# Patient Record
Sex: Female | Born: 1945 | Race: White | Hispanic: No | Marital: Married | Smoking: Never smoker
Health system: Southern US, Community
[De-identification: ages and names within clinical notes are randomized; demographics above are authoritative.]

## PROBLEM LIST (undated history)

## (undated) DIAGNOSIS — E785 Hyperlipidemia, unspecified: Secondary | ICD-10-CM

## (undated) DIAGNOSIS — K250 Acute gastric ulcer with hemorrhage: Secondary | ICD-10-CM

## (undated) DIAGNOSIS — M810 Age-related osteoporosis without current pathological fracture: Secondary | ICD-10-CM

## (undated) DIAGNOSIS — M1990 Unspecified osteoarthritis, unspecified site: Secondary | ICD-10-CM

## (undated) DIAGNOSIS — K5790 Diverticulosis of intestine, part unspecified, without perforation or abscess without bleeding: Secondary | ICD-10-CM

## (undated) DIAGNOSIS — H269 Unspecified cataract: Secondary | ICD-10-CM

## (undated) DIAGNOSIS — Z5189 Encounter for other specified aftercare: Secondary | ICD-10-CM

## (undated) DIAGNOSIS — K219 Gastro-esophageal reflux disease without esophagitis: Secondary | ICD-10-CM

## (undated) HISTORY — DX: Hyperlipidemia, unspecified: E78.5

## (undated) HISTORY — DX: Unspecified cataract: H26.9

## (undated) HISTORY — DX: Unspecified osteoarthritis, unspecified site: M19.90

## (undated) HISTORY — PX: ABDOMINAL HYSTERECTOMY: SHX81

## (undated) HISTORY — DX: Acute gastric ulcer with hemorrhage: K25.0

## (undated) HISTORY — PX: TONSILLECTOMY: SHX5217

## (undated) HISTORY — DX: Gastro-esophageal reflux disease without esophagitis: K21.9

## (undated) HISTORY — DX: Age-related osteoporosis without current pathological fracture: M81.0

## (undated) HISTORY — DX: Encounter for other specified aftercare: Z51.89

## (undated) HISTORY — PX: EYE SURGERY: SHX253

## (undated) HISTORY — DX: Diverticulosis of intestine, part unspecified, without perforation or abscess without bleeding: K57.90

---

## 1998-11-03 ENCOUNTER — Other Ambulatory Visit: Admission: RE | Admit: 1998-11-03 | Discharge: 1998-11-03 | Payer: Self-pay | Admitting: Obstetrics and Gynecology

## 2000-03-08 ENCOUNTER — Other Ambulatory Visit: Admission: RE | Admit: 2000-03-08 | Discharge: 2000-03-08 | Payer: Self-pay | Admitting: Obstetrics and Gynecology

## 2001-12-25 ENCOUNTER — Other Ambulatory Visit: Admission: RE | Admit: 2001-12-25 | Discharge: 2001-12-25 | Payer: Self-pay | Admitting: Obstetrics and Gynecology

## 2001-12-31 ENCOUNTER — Encounter: Payer: Self-pay | Admitting: Obstetrics and Gynecology

## 2001-12-31 ENCOUNTER — Encounter: Admission: RE | Admit: 2001-12-31 | Discharge: 2001-12-31 | Payer: Self-pay | Admitting: Obstetrics and Gynecology

## 2003-07-10 ENCOUNTER — Encounter: Admission: RE | Admit: 2003-07-10 | Discharge: 2003-07-10 | Payer: Self-pay | Admitting: Obstetrics and Gynecology

## 2003-07-10 ENCOUNTER — Encounter: Payer: Self-pay | Admitting: Obstetrics and Gynecology

## 2005-11-18 ENCOUNTER — Encounter: Admission: RE | Admit: 2005-11-18 | Discharge: 2005-11-18 | Payer: Self-pay | Admitting: Obstetrics and Gynecology

## 2006-03-03 ENCOUNTER — Emergency Department (HOSPITAL_COMMUNITY): Admission: EM | Admit: 2006-03-03 | Discharge: 2006-03-04 | Payer: Self-pay | Admitting: Emergency Medicine

## 2008-07-31 ENCOUNTER — Encounter: Admission: RE | Admit: 2008-07-31 | Discharge: 2008-07-31 | Payer: Self-pay | Admitting: Obstetrics and Gynecology

## 2008-08-06 ENCOUNTER — Encounter: Admission: RE | Admit: 2008-08-06 | Discharge: 2008-08-06 | Payer: Self-pay | Admitting: Obstetrics and Gynecology

## 2010-08-12 ENCOUNTER — Encounter: Admission: RE | Admit: 2010-08-12 | Discharge: 2010-08-12 | Payer: Self-pay | Admitting: Obstetrics and Gynecology

## 2010-12-12 DIAGNOSIS — Z860101 Personal history of adenomatous and serrated colon polyps: Secondary | ICD-10-CM

## 2010-12-12 HISTORY — DX: Personal history of adenomatous and serrated colon polyps: Z86.0101

## 2011-03-31 ENCOUNTER — Encounter: Payer: Self-pay | Admitting: Internal Medicine

## 2011-03-31 ENCOUNTER — Ambulatory Visit (AMBULATORY_SURGERY_CENTER): Payer: 59 | Admitting: *Deleted

## 2011-03-31 VITALS — Ht 62.0 in | Wt 115.0 lb

## 2011-03-31 DIAGNOSIS — Z1211 Encounter for screening for malignant neoplasm of colon: Secondary | ICD-10-CM

## 2011-03-31 MED ORDER — PEG-KCL-NACL-NASULF-NA ASC-C 100 G PO SOLR: 1.0000 | Freq: Once | ORAL | Status: AC

## 2011-04-22 ENCOUNTER — Ambulatory Visit (AMBULATORY_SURGERY_CENTER): Payer: 59 | Admitting: Internal Medicine

## 2011-04-22 ENCOUNTER — Encounter: Payer: Self-pay | Admitting: Internal Medicine

## 2011-04-22 VITALS — BP 146/73 | HR 120 | Temp 96.8°F | Resp 21 | Ht 62.0 in | Wt 112.0 lb

## 2011-04-22 DIAGNOSIS — D126 Benign neoplasm of colon, unspecified: Secondary | ICD-10-CM

## 2011-04-22 DIAGNOSIS — Z1211 Encounter for screening for malignant neoplasm of colon: Secondary | ICD-10-CM

## 2011-04-22 DIAGNOSIS — K5730 Diverticulosis of large intestine without perforation or abscess without bleeding: Secondary | ICD-10-CM

## 2011-04-22 NOTE — Patient Instructions (Signed)
Please follow all discharge instructions given to you today. One colon polyps removed and diverticulosis seen. Read over handouts given on polyps,diverticulosis and high fiber diet. Please repeat colonoscopy in 5 or 10 years, you will receive result letter in your mail in about 2 weeks. Resume current medications. Any questions or concerns give [place] a call.

## 2011-04-25 ENCOUNTER — Telehealth: Payer: Self-pay | Admitting: *Deleted

## 2011-04-25 NOTE — Telephone Encounter (Signed)

## 2011-09-07 NOTE — Progress Notes (Signed)
Addended by: Maple Hudson on: 09/07/2011 04:39 PM   Modules accepted: Level of Service

## 2012-02-14 ENCOUNTER — Other Ambulatory Visit: Payer: Self-pay | Admitting: Obstetrics and Gynecology

## 2012-02-14 DIAGNOSIS — Z1231 Encounter for screening mammogram for malignant neoplasm of breast: Secondary | ICD-10-CM

## 2012-03-07 ENCOUNTER — Ambulatory Visit: Payer: 59

## 2014-04-01 ENCOUNTER — Ambulatory Visit: Payer: No Typology Code available for payment source

## 2014-04-01 ENCOUNTER — Ambulatory Visit (INDEPENDENT_AMBULATORY_CARE_PROVIDER_SITE_OTHER): Payer: No Typology Code available for payment source | Admitting: Family Medicine

## 2014-04-01 VITALS — BP 126/74 | HR 72 | Temp 98.2°F | Resp 18 | Ht 62.0 in | Wt 121.0 lb

## 2014-04-01 DIAGNOSIS — S92309A Fracture of unspecified metatarsal bone(s), unspecified foot, initial encounter for closed fracture: Secondary | ICD-10-CM

## 2014-04-01 DIAGNOSIS — M25579 Pain in unspecified ankle and joints of unspecified foot: Secondary | ICD-10-CM

## 2014-04-01 DIAGNOSIS — S92302A Fracture of unspecified metatarsal bone(s), left foot, initial encounter for closed fracture: Secondary | ICD-10-CM

## 2014-04-01 NOTE — Progress Notes (Signed)
   Subjective:    Patient ID: Dana Waters, female    DOB: January 01, 1946, 68 y.o.   MRN: [id number]  HPI 68 year old female presents for evaluation of left foot pain x 2 weeks. Symptoms started after an injury that occurred Easter weekend while she was [place] the beach. She is not sure exactly what happened but tripped over her foot and may have struck it against a door jam.  Did have pain initially but did not think it was "that bad."  Has been able to bear weight but has pain while doing so. Also admits to pain at rest.  She has some swelling in her foot that is worse at the end of the day.  Has taken ibuprofen prn which does seem to help.  Denies paresthesias or weakness.  Patient is otherwise doing well with no other concerns today.      Review of Systems  Musculoskeletal: Positive for joint swelling. Negative for gait problem.  Skin: Positive for color change. Negative for wound.  Neurological: Negative for weakness and numbness.       Objective:   Physical Exam  Constitutional: She is oriented to person, place, and time. She appears well-developed and well-nourished.  HENT:  Head: Normocephalic and atraumatic.  Right Ear: External ear normal.  Left Ear: External ear normal.  Eyes: Conjunctivae are normal.  Neck: Normal range of motion.  Cardiovascular: Normal rate.   Pulmonary/Chest: Effort normal.  Musculoskeletal:       Left ankle: Normal.       Feet:  Noted area has ecchymosis and STS.  +TTP at 4th MTP and along 4th metatarsal. Capillary refill normal <2 seconds. Sensation intact. Strength and ROM normal.   Neurological: She is alert and oriented to person, place, and time.  Psychiatric: She has a normal mood and affect. Her behavior is normal. Judgment and thought content normal.     [facility] reading (PRIMARY) by  Dr. Lorelei Pont as transverse fracture of 4th and 5th metatarsal with minimal displacement.       Assessment & Plan:  Pain in joint, ankle and foot - Plan: DG Foot  Complete Left  Fracture of metatarsal bone of left foot  Placed in short cam walker for comfort.  Referral to Orthopedics for evaluation and management - unstable fracture of 2 metatarsals.  Patient declined crutches at this time - will await instruction from Ortho.

## 2014-05-08 ENCOUNTER — Other Ambulatory Visit: Payer: Self-pay | Admitting: Obstetrics and Gynecology

## 2014-06-10 DIAGNOSIS — G4700 Insomnia, unspecified: Secondary | ICD-10-CM | POA: Insufficient documentation

## 2014-06-10 DIAGNOSIS — H11009 Unspecified pterygium of unspecified eye: Secondary | ICD-10-CM | POA: Insufficient documentation

## 2014-06-10 DIAGNOSIS — F4329 Adjustment disorder with other symptoms: Secondary | ICD-10-CM | POA: Insufficient documentation

## 2015-09-20 ENCOUNTER — Ambulatory Visit (INDEPENDENT_AMBULATORY_CARE_PROVIDER_SITE_OTHER): Payer: Medicare Other

## 2015-09-20 ENCOUNTER — Ambulatory Visit (INDEPENDENT_AMBULATORY_CARE_PROVIDER_SITE_OTHER): Payer: Medicare Other | Admitting: Internal Medicine

## 2015-09-20 ENCOUNTER — Encounter (HOSPITAL_COMMUNITY): Payer: Self-pay | Admitting: *Deleted

## 2015-09-20 ENCOUNTER — Emergency Department (HOSPITAL_COMMUNITY)
Admission: EM | Admit: 2015-09-20 | Discharge: 2015-09-20 | Disposition: A | Discharge status: Home / Self Care | Payer: Medicare Other | Attending: Emergency Medicine | Admitting: Emergency Medicine

## 2015-09-20 ENCOUNTER — Emergency Department (HOSPITAL_COMMUNITY): Payer: Medicare Other

## 2015-09-20 VITALS — BP 134/80 | HR 64 | Temp 98.0°F | Resp 12 | Ht 62.0 in | Wt 121.0 lb

## 2015-09-20 DIAGNOSIS — X58XXXA Exposure to other specified factors, initial encounter: Secondary | ICD-10-CM | POA: Insufficient documentation

## 2015-09-20 DIAGNOSIS — R519 Headache, unspecified: Secondary | ICD-10-CM

## 2015-09-20 DIAGNOSIS — S0033XA Contusion of nose, initial encounter: Secondary | ICD-10-CM | POA: Diagnosis not present

## 2015-09-20 DIAGNOSIS — R51 Headache: Secondary | ICD-10-CM | POA: Diagnosis not present

## 2015-09-20 DIAGNOSIS — Y9289 Other specified places as the place of occurrence of the external cause: Secondary | ICD-10-CM | POA: Diagnosis not present

## 2015-09-20 DIAGNOSIS — Z79899 Other long term (current) drug therapy: Secondary | ICD-10-CM | POA: Diagnosis not present

## 2015-09-20 DIAGNOSIS — S0031XA Abrasion of nose, initial encounter: Secondary | ICD-10-CM | POA: Diagnosis not present

## 2015-09-20 DIAGNOSIS — S0121XA Laceration without foreign body of nose, initial encounter: Secondary | ICD-10-CM

## 2015-09-20 DIAGNOSIS — S0120XA Unspecified open wound of nose, initial encounter: Secondary | ICD-10-CM

## 2015-09-20 DIAGNOSIS — S022XXB Fracture of nasal bones, initial encounter for open fracture: Secondary | ICD-10-CM | POA: Insufficient documentation

## 2015-09-20 DIAGNOSIS — Z23 Encounter for immunization: Secondary | ICD-10-CM

## 2015-09-20 DIAGNOSIS — Y998 Other external cause status: Secondary | ICD-10-CM | POA: Diagnosis not present

## 2015-09-20 DIAGNOSIS — Y9389 Activity, other specified: Secondary | ICD-10-CM | POA: Diagnosis not present

## 2015-09-20 MED ORDER — OXYCODONE-ACETAMINOPHEN 5-325 MG PO TABS
1.0000 | ORAL_TABLET | Freq: Once | ORAL | Status: AC
  Administered 2015-09-20: 1 via ORAL
  Filled 2015-09-20: qty 1

## 2015-09-20 MED ORDER — OXYCODONE-ACETAMINOPHEN 5-325 MG PO TABS: 1.0000 | ORAL_TABLET | Freq: Four times a day (QID) | ORAL | Status: DC | PRN

## 2015-09-20 MED ORDER — HYDROCODONE-ACETAMINOPHEN 5-325 MG PO TABS: 1.0000 | ORAL_TABLET | Freq: Four times a day (QID) | ORAL | Status: DC | PRN

## 2015-09-20 MED ORDER — AMOXICILLIN-POT CLAVULANATE 875-125 MG PO TABS: 1.0000 | ORAL_TABLET | Freq: Two times a day (BID) | ORAL | Status: DC

## 2015-09-20 NOTE — ED Notes (Signed)
Patient and family voiced concerns about not seeing plastic surgery as soon as they got here. Advised the ER MD has to examine then consult which they just put in. Family asked how long would it be advised could not give a timeline.

## 2015-09-20 NOTE — ED Provider Notes (Signed)
CSN: [id number]     Arrival date & time 09/20/15  1641 History   First MD Initiated Contact with Patient 09/20/15 1803     Chief Complaint  Patient presents with  . Laceration     (Consider location/radiation/quality/duration/timing/severity/associated sxs/prior Treatment) HPI Comments: 69yo  F who p/w nose laceration. Earlier today, the patient tripped and fell over a curb and landed face first on the pavement. She did not lose consciousness. She sustained a laceration to her nose and had bleeding from laceration site as well as bilateral nares. She went to an urgent care, where plain films showed a nasal bone fracture and she was sent here for plastic surgery evaluation. She received tetanus prior to transport. Patient scraped her left knee but she otherwise did not sustain any other injuries. She denies any visual changes, headache, neck pain, or extremity pain.  Patient is a 69 y.o. female presenting with skin laceration. The history is provided by the patient.  Laceration   History reviewed. No pertinent past medical history. Past Surgical History  Procedure Laterality Date  . Abdominal hysterectomy    . Tonsillectomy     Family History  Problem Relation Age of Onset  . Colon cancer Paternal Aunt   . Hypertension Mother   . Stroke Mother   . Hypertension Father   . Hypertension Sister    Social History  Substance Use Topics  . Smoking status: Never Smoker   . Smokeless tobacco: Never Used  . Alcohol Use: 1.5 oz/week    3 drink(s) per week   OB History    No data available     Review of Systems  10 Systems reviewed and are negative for acute change except as noted in the HPI.   Allergies  Review of patient's allergies indicates no known allergies.  Home Medications   Prior to Admission medications   Medication Sig Start Date End Date Taking? Authorizing Provider  CALCIUM-VITAMIN D PO Take 1 tablet by mouth daily.   Yes Historical Provider, MD  citalopram  (CELEXA) 20 MG tablet Take 20 mg by mouth at bedtime.    Yes Historical Provider, MD  estradiol (ESTRACE) 2 MG tablet Take 2 mg by mouth daily.   Yes Historical Provider, MD  ibuprofen (ADVIL,MOTRIN) 200 MG tablet Take 200 mg by mouth every 8 (eight) hours as needed for moderate pain.    Yes Historical Provider, MD  Multiple Vitamins-Minerals (ALIVE WOMENS 50+) TABS Take 1 tablet by mouth daily as needed (when remembers).   Yes Historical Provider, MD  amoxicillin-clavulanate (AUGMENTIN) 875-125 MG tablet Take 1 tablet by mouth every 12 (twelve) hours. 09/20/15   Sharlett Iles, MD  HYDROcodone-acetaminophen (NORCO/VICODIN) 5-325 MG tablet Take 1 tablet by mouth every 6 (six) hours as needed. 09/20/15   Orma Flaming, MD  oxyCODONE-acetaminophen (PERCOCET) 5-325 MG tablet Take 1 tablet by mouth every 6 (six) hours as needed for severe pain. 09/20/15   Wenda Overland Little, MD   BP 106/83 mmHg  Pulse 66  Temp(Src) 98 F (36.7 C) (Oral)  Resp 18  Ht 5\' 2"  (1.575 m)  Wt 123 lb (55.792 kg)  BMI 22.49 kg/m2  SpO2 99% Physical Exam  Constitutional: She is oriented to person, place, and time. She appears well-developed and well-nourished. No distress.  HENT:  Head: Normocephalic.  Moist mucous membranes, posterior oropharynx clear, 1 cm laceration over top of nasal bridge with some nasal bone exposure; edema and developing ecchymoses on b/l sides of nose  Eyes: Conjunctivae and EOM are normal. Pupils are equal, round, and reactive to light.  Neck: Neck supple.  Cardiovascular: Normal rate, regular rhythm and normal heart sounds.   No murmur heard. Pulmonary/Chest: Effort normal and breath sounds normal.  Abdominal: Soft. Bowel sounds are normal. She exhibits no distension. There is no tenderness.  Musculoskeletal: She exhibits no edema.  Neurological: She is alert and oriented to person, place, and time.  Fluent speech  Skin: Skin is warm and dry.  Psychiatric: She has a normal mood and  affect. Judgment normal.  Nursing note and vitals reviewed.   ED Course  Procedures (including critical care time) Labs Review Labs Reviewed - No data to display  Imaging Review Dg Nasal Bones  09/20/2015   CLINICAL DATA:  Status post fall and trauma to the nose.  EXAM: NASAL BONES - 3+ VIEW  COMPARISON:  None.  FINDINGS: There is a comminuted fracture of the tip of the nasal bones bilaterally, with minimal overlying soft tissue irregularity. The acuity of this finding is unknown. Linear lucencies through the bilateral orbits likely represent closed eye lids.  Dental hardware is noted. Visualized paranasal sinuses are normally aerated.  IMPRESSION: Comminuted bilateral nasal fracture with unknown acuity.   Electronically Signed   By: Fidela Salisbury M.D.   On: 09/20/2015 15:37   Ct Maxillofacial Wo Cm  09/20/2015   CLINICAL DATA:  Fall on face. Laceration to bridge of nose from glasses.  EXAM: CT MAXILLOFACIAL WITHOUT CONTRAST  TECHNIQUE: Multidetector CT imaging of the maxillofacial structures was performed. Multiplanar CT image reconstructions were also generated. A small metallic BB was placed on the right temple in order to reliably differentiate right from left.  COMPARISON:  Nasal bone radiographs from the same day.  FINDINGS: Bilateral nasal bone fractures are confirmed. These appear acute. There is significant laceration over the nasal bones. No radiopaque foreign body is evident.  There is no acute fracture in the nasal septum. Prominent leftward spurring extends 9.5 mm left of midline and contacts the left middle turbinate. This is not obstructive. The ostiomeatal complex is patent bilaterally. The paranasal sinuses and mastoid air cells are otherwise clear.  Limited imaging the brain demonstrates mild Presas matter disease, within normal limits for age. The globes and orbits are intact.  IMPRESSION: 1. Bilateral nasal bone fractures with mild displacement. 2. Prominent laceration over the  nasal bones without radiopaque foreign body. 3. Prominent leftward nasal septal spurring without acute fracture in the nasal septum.   Electronically Signed   By: San Morelle M.D.   On: 09/20/2015 19:25    Medications  oxyCODONE-acetaminophen (PERCOCET/ROXICET) 5-325 MG per tablet 1 tablet (not administered)    MDM   Final diagnoses:  Laceration of nose, initial encounter  Nasal bone fractures, open, initial encounter   69 year old female who presents with a nasal laceration and underlying nasal bone fracture diagnosed at urgent care after patient fell face first onto pavement. Tetanus vaccination given at [facility]. Pt uncomfortable but NAD at presentation. I was able to see a small amount of bone exposure through laceration. Obtained a maxillofacial CT for better characterization of fractures. At patient request, consulted ENT surgery for evaluation. I appreciate Dr.Teoh's assistance with patient's care. He repaired the laceration at bedside and has instructed patient on wound care and follow-up in one week for suture removal. Per his request, I provided patient with pain medication as well as Augmentin. I reviewed signs of infection as well as nose blowing precautions. Patient  voiced understanding and was discharged in satisfactory condition.   Sharlett Iles, MD 09/20/15 [phone]

## 2015-09-20 NOTE — Discharge Instructions (Signed)
Facial Laceration ° A facial laceration is a cut on the face. These injuries can be painful and cause bleeding. Lacerations usually heal quickly, but they need special care to reduce scarring. °DIAGNOSIS  °Your health care provider will take a medical history, ask for details about how the injury occurred, and examine the wound to determine how deep the cut is. °TREATMENT  °Some facial lacerations may not require closure. Others may not be able to be closed because of an increased risk of infection. The risk of infection and the chance for successful closure will depend on various factors, including the amount of time since the injury occurred. °The wound may be cleaned to help prevent infection. If closure is appropriate, pain medicines may be given if needed. Your health care provider will use stitches (sutures), wound glue (adhesive), or skin adhesive strips to repair the laceration. These tools bring the skin edges together to allow for faster healing and a better cosmetic outcome. If needed, you may also be given a tetanus shot. °HOME CARE INSTRUCTIONS °· Only take over-the-counter or prescription medicines as directed by your health care provider. °· Follow your health care provider's instructions for wound care. These instructions will vary depending on the technique used for closing the wound. °For Sutures: °· Keep the wound clean and dry.   °· If you were given a bandage (dressing), you should change it at least once a day. Also change the dressing if it becomes wet or dirty, or as directed by your health care provider.   °· Wash the wound with soap and water 2 times a day. Rinse the wound off with water to remove all soap. Pat the wound dry with a clean towel.   °· After cleaning, apply a thin layer of the antibiotic ointment recommended by your health care provider. This will help prevent infection and keep the dressing from sticking.   °· You may shower as usual after the first 24 hours. Do not soak the  wound in water until the sutures are removed.   °· Get your sutures removed as directed by your health care provider. With facial lacerations, sutures should usually be taken out after 4-5 days to avoid stitch marks.   °· Wait a few days after your sutures are removed before applying any makeup. °For Skin Adhesive Strips: °· Keep the wound clean and dry.   °· Do not get the skin adhesive strips wet. You may bathe carefully, using caution to keep the wound dry.   °· If the wound gets wet, pat it dry with a clean towel.   °· Skin adhesive strips will fall off on their own. You may trim the strips as the wound heals. Do not remove skin adhesive strips that are still stuck to the wound. They will fall off in time.   °For Wound Adhesive: °· You may briefly wet your wound in the shower or bath. Do not soak or scrub the wound. Do not swim. Avoid periods of heavy sweating until the skin adhesive has fallen off on its own. After showering or bathing, gently pat the wound dry with a clean towel.   °· Do not apply liquid medicine, cream medicine, ointment medicine, or makeup to your wound while the skin adhesive is in place. This may loosen the film before your wound is healed.   °· If a dressing is placed over the wound, be careful not to apply tape directly over the skin adhesive. This may cause the adhesive to be pulled off before the wound is healed.   °· Avoid   prolonged exposure to sunlight or tanning lamps while the skin adhesive is in place. °· The skin adhesive will usually remain in place for 5-10 days, then naturally fall off the skin. Do not pick at the adhesive film.   °After Healing: °Once the wound has healed, cover the wound with sunscreen during the day for 1 full year. This can help minimize scarring. Exposure to ultraviolet light in the first year will darken the scar. It can take 1-2 years for the scar to lose its redness and to heal completely.  °SEEK MEDICAL CARE IF: °· You have a fever. °SEEK IMMEDIATE  MEDICAL CARE IF: °· You have redness, pain, or swelling around the wound.   °· You see a yellowish-Gary fluid (pus) coming from the wound.   °  °This information is not intended to replace advice given to you by your health care provider. Make sure you discuss any questions you have with your health care provider. °  °Document Released: 01/05/2005 Document Revised: 12/19/2014 Document Reviewed: 07/11/2013 °Elsevier Interactive Patient Education ©2016 [employer] ° °

## 2015-09-20 NOTE — Consult Note (Signed)
Reason for Consult: Facial trauma, nasal fractures, nasal laceration  HPI:  Dana Waters is an 69 y.o. female who presents to [facility] ER today for treatment of her nasal fractures and laceration. Earlier today, the patient tripped and fell over a curb and landed face first on the pavement. She did not lose consciousness. She sustained a laceration to her nose and had bleeding from laceration site as well as bilateral nares. She went to an urgent care, where plain films showed a nasal bone fracture and she was sent here for surgery evaluation. She received tetanus prior to transport. Patient scraped her left knee but she otherwise did not sustain any other injuries. She denies any visual changes, headache, neck pain, or extremity pain. Her facial CT shows minimally displaced bilateral nasal bone fractures.  History reviewed. No pertinent past medical history.  Past Surgical History  Procedure Laterality Date  . Abdominal hysterectomy    . Tonsillectomy      Family History  Problem Relation Age of Onset  . Colon cancer Paternal Aunt   . Hypertension Mother   . Stroke Mother   . Hypertension Father   . Hypertension Sister     Social History:  reports that she has never smoked. She has never used smokeless tobacco. She reports that she drinks about 1.5 oz of alcohol per week. She reports that she does not use illicit drugs.  Allergies: No Known Allergies  Prior to Admission medications   Medication Sig Start Date End Date Taking? Authorizing Provider  CALCIUM-VITAMIN D PO Take 1 tablet by mouth daily.   Yes Historical Provider, MD  citalopram (CELEXA) 20 MG tablet Take 20 mg by mouth at bedtime.    Yes Historical Provider, MD  estradiol (ESTRACE) 2 MG tablet Take 2 mg by mouth daily.   Yes Historical Provider, MD  ibuprofen (ADVIL,MOTRIN) 200 MG tablet Take 200 mg by mouth every 8 (eight) hours as needed for moderate pain.    Yes Historical Provider, MD  Multiple Vitamins-Minerals (ALIVE  WOMENS 50+) TABS Take 1 tablet by mouth daily as needed (when remembers).   Yes Historical Provider, MD  amoxicillin-clavulanate (AUGMENTIN) 875-125 MG tablet Take 1 tablet by mouth every 12 (twelve) hours. 09/20/15   Sharlett Iles, MD  HYDROcodone-acetaminophen (NORCO/VICODIN) 5-325 MG tablet Take 1 tablet by mouth every 6 (six) hours as needed. 09/20/15   Orma Flaming, MD  oxyCODONE-acetaminophen (PERCOCET) 5-325 MG tablet Take 1 tablet by mouth every 6 (six) hours as needed for severe pain. 09/20/15   Sharlett Iles, MD    No results found for this or any previous visit (from the past 48 hour(s)).  Dg Nasal Bones  09/20/2015   CLINICAL DATA:  Status post fall and trauma to the nose.  EXAM: NASAL BONES - 3+ VIEW  COMPARISON:  None.  FINDINGS: There is a comminuted fracture of the tip of the nasal bones bilaterally, with minimal overlying soft tissue irregularity. The acuity of this finding is unknown. Linear lucencies through the bilateral orbits likely represent closed eye lids.  Dental hardware is noted. Visualized paranasal sinuses are normally aerated.  IMPRESSION: Comminuted bilateral nasal fracture with unknown acuity.   Electronically Signed   By: Fidela Salisbury M.D.   On: 09/20/2015 15:37   Ct Maxillofacial Wo Cm  09/20/2015   CLINICAL DATA:  Fall on face. Laceration to bridge of nose from glasses.  EXAM: CT MAXILLOFACIAL WITHOUT CONTRAST  TECHNIQUE: Multidetector CT imaging of the maxillofacial structures was  performed. Multiplanar CT image reconstructions were also generated. A small metallic BB was placed on the right temple in order to reliably differentiate right from left.  COMPARISON:  Nasal bone radiographs from the same day.  FINDINGS: Bilateral nasal bone fractures are confirmed. These appear acute. There is significant laceration over the nasal bones. No radiopaque foreign body is evident.  There is no acute fracture in the nasal septum. Prominent leftward spurring  extends 9.5 mm left of midline and contacts the left middle turbinate. This is not obstructive. The ostiomeatal complex is patent bilaterally. The paranasal sinuses and mastoid air cells are otherwise clear.  Limited imaging the brain demonstrates mild Lipa matter disease, within normal limits for age. The globes and orbits are intact.  IMPRESSION: 1. Bilateral nasal bone fractures with mild displacement. 2. Prominent laceration over the nasal bones without radiopaque foreign body. 3. Prominent leftward nasal septal spurring without acute fracture in the nasal septum.   Electronically Signed   By: San Morelle M.D.   On: 09/20/2015 19:25   Review of Systems 10 Systems reviewed and are negative for acute change except as noted in the HPI.  Blood pressure 133/57, pulse 70, temperature 98 F (36.7 C), temperature source Oral, resp. rate 18, height 5\' 2"  (1.575 m), weight 55.792 kg (123 lb), SpO2 98 %. Physical Exam  Constitutional: She is oriented to person, place, and time. She appears well-developed and well-nourished. No distress.  Head: Normocephalic.  Nose: 2 cm laceration over top of nasal bridge with some nasal bone exposure; edema and ecchymoses on both sides of nose. No significant dorsal deformity. Nasal septum is midline. Ears: Auricles and ear canals are normal bilaterally. Eyes: Conjunctivae and EOM are normal. Pupils are equal, round, and reactive to light.  Mouth: Normal mucosa, tongue, and lips. Neck: Neck supple.  Cardiovascular: Normal rate, regular rhythm and normal heart sounds.  Pulmonary/Chest: Effort normal and breath sounds normal.  Musculoskeletal: She exhibits no edema.  Neurological: She is alert and oriented to person, place, and time.  Fluent speech  Skin: Skin is warm and dry.  Psychiatric: She has a normal mood and affect. Judgment normal.  Nursing note and vitals reviewed.  Procedure: Complex repair of 2cm nasal laceration Anesthesia: Local anesthesia  with 1% lidocaine with 1:100,000 epinephrine Description: The patient is placed supine on the operating table. The nose is prepped and draped in a sterile fashion.  After adequate local anesthesia is achieved, the laceration site is carefully debrided.  The underlying nasal bone is debrided.  Extensive soft tissue undermining is performed to release the skin tension. The laceration is closed in layers with interrupted sutures.  The patient tolerated the procedure well.    Assessment/Plan: Complex nasal dorsal laceration and minimally displaced bilateral nasal bone fractures. The nasal laceration is repaired in the emergency room under local anesthesia. Her nasal fractures likely will not need any surgical intervention. The patient will follow up in my office in one week for suture removal.  Nomie Buchberger,SUI W 09/20/2015, 11:10 PM

## 2015-09-20 NOTE — Progress Notes (Signed)
Patient ID: Dana Waters, female   DOB: January 22, 1946, 69 y.o.   MRN: [id number]   09/20/2015 at 3:23 PM  Dana Waters / DOB: 1946/12/12 / MRN: [id number]  Problem list reviewed and updated by me where necessary.   SUBJECTIVE  Dana Waters is a 69 y.o. ill appearing female presenting for the chief complaint of fell injured nose has wound and nose bleed active now. No LOC, no chronic diseases. She fell because foot caught on elevated ground object. No HA or neuro sxs, no other external injuries..  No other bone or joint pain. Usually healthy only on hormones. Last Td 10 years and never had Tdap.   She  has no past medical history on file.    Medications reviewed and updated by myself where necessary, and exist elsewhere in the encounter.   Dana Waters has No Known Allergies. She  reports that she has never smoked. She has never used smokeless tobacco. She reports that she drinks about 1.5 oz of alcohol per week. She reports that she does not use illicit drugs. She  has no sexual activity history on file. The patient  has past surgical history that includes Abdominal hysterectomy and Tonsillectomy.  Her family history includes Colon cancer in her paternal aunt; Hypertension in her father, mother, and sister; Stroke in her mother.  Review of Systems  Constitutional: Negative.   HENT: Positive for nosebleeds. Negative for hearing loss.   Eyes: Negative.   Respiratory: Negative.   Cardiovascular: Negative.   Musculoskeletal: Positive for falls. Negative for neck pain.  Skin: Negative.   Neurological: Negative for dizziness, tremors, sensory change, speech change, focal weakness, seizures and loss of consciousness.  Psychiatric/Behavioral: Negative.     OBJECTIVE  Her  height is 5\' 2"  (1.575 m) and weight is 121 lb (54.885 kg). Her oral temperature is 98 F (36.7 C). Her blood pressure is 134/80 and her pulse is 64. Her respiration is 12 and oxygen saturation is 99%.  The patient's body mass  index is 22.13 kg/(m^2).  Physical Exam  Vitals reviewed. Constitutional: She is oriented to person, place, and time. She appears well-developed and well-nourished. She appears distressed.  HENT:  Head: Normocephalic.    Nose: Mucosal edema, nose lacerations, nasal deformity and septal deviation present. Epistaxis is observed. Right sinus exhibits no maxillary sinus tenderness and no frontal sinus tenderness. Left sinus exhibits no maxillary sinus tenderness and no frontal sinus tenderness.    Eyes: EOM are normal. Pupils are equal, round, and reactive to light.  Neck: Normal range of motion. Neck supple.  Cardiovascular: Normal rate and normal heart sounds.   Respiratory: Effort normal.  Musculoskeletal: Normal range of motion.  Neurological: She is alert and oriented to person, place, and time. She displays normal reflexes. No cranial nerve deficit. She exhibits normal muscle tone. Coordination normal.  Skin: Laceration noted.  Psychiatric: She has a normal mood and affect. Her behavior is normal.    No results found for this or any previous visit (from the past 24 hour(s)).    [facility] reading (PRIMARY) by  Dr.Ritika Hellickson cominuted fx of nasal bone, lucency thru orbits needs radiology stat read..   ASSESSMENT & PLAN Call in to ENT/Facial on call at [facility]--Dr. Luiz Ochoa was seen today for fall, facial pain, laceration and abrasion.  Diagnoses and all orders for this visit:  Wound, open, nose, initial encounter -     DG Nasal Bones; Future -     Tdap vaccine greater than or equal  to 7yo IM  Contusion, nose, initial encounter -     DG Nasal Bones; Future -     Tdap vaccine greater than or equal to 7yo IM  Wound, open, nose with complication, initial encounter -     Tdap vaccine greater than or equal to 7yo IM

## 2015-09-20 NOTE — ED Notes (Signed)
Patient left at this time with all belongings. Verbalizes understanding regarding follow up

## 2015-09-20 NOTE — Patient Instructions (Signed)
Facial Laceration  A facial laceration is a cut on the face. These injuries can be painful and cause bleeding. Lacerations usually heal quickly, but they need special care to reduce scarring. DIAGNOSIS  Your health care provider will take a medical history, ask for details about how the injury occurred, and examine the wound to determine how deep the cut is. TREATMENT  Some facial lacerations may not require closure. Others may not be able to be closed because of an increased risk of infection. The risk of infection and the chance for successful closure will depend on various factors, including the amount of time since the injury occurred. The wound may be cleaned to help prevent infection. If closure is appropriate, pain medicines may be given if needed. Your health care provider will use stitches (sutures), wound glue (adhesive), or skin adhesive strips to repair the laceration. These tools bring the skin edges together to allow for faster healing and a better cosmetic outcome. If needed, you may also be given a tetanus shot. HOME CARE INSTRUCTIONS  Only take over-the-counter or prescription medicines as directed by your health care provider.  Follow your health care provider's instructions for wound care. These instructions will vary depending on the technique used for closing the wound. For Sutures:  Keep the wound clean and dry.   If you were given a bandage (dressing), you should change it at least once a day. Also change the dressing if it becomes wet or dirty, or as directed by your health care provider.   Wash the wound with soap and water 2 times a day. Rinse the wound off with water to remove all soap. Pat the wound dry with a clean towel.   After cleaning, apply a thin layer of the antibiotic ointment recommended by your health care provider. This will help prevent infection and keep the dressing from sticking.   You may shower as usual after the first 24 hours. Do not soak the  wound in water until the sutures are removed.   Get your sutures removed as directed by your health care provider. With facial lacerations, sutures should usually be taken out after 4-5 days to avoid stitch marks.   Wait a few days after your sutures are removed before applying any makeup. For Skin Adhesive Strips:  Keep the wound clean and dry.   Do not get the skin adhesive strips wet. You may bathe carefully, using caution to keep the wound dry.   If the wound gets wet, pat it dry with a clean towel.   Skin adhesive strips will fall off on their own. You may trim the strips as the wound heals. Do not remove skin adhesive strips that are still stuck to the wound. They will fall off in time.  For Wound Adhesive:  You may briefly wet your wound in the shower or bath. Do not soak or scrub the wound. Do not swim. Avoid periods of heavy sweating until the skin adhesive has fallen off on its own. After showering or bathing, gently pat the wound dry with a clean towel.   Do not apply liquid medicine, cream medicine, ointment medicine, or makeup to your wound while the skin adhesive is in place. This may loosen the film before your wound is healed.   If a dressing is placed over the wound, be careful not to apply tape directly over the skin adhesive. This may cause the adhesive to be pulled off before the wound is healed.   Avoid   prolonged exposure to sunlight or tanning lamps while the skin adhesive is in place.  The skin adhesive will usually remain in place for 5-10 days, then naturally fall off the skin. Do not pick at the adhesive film.  After Healing: Once the wound has healed, cover the wound with sunscreen during the day for 1 full year. This can help minimize scarring. Exposure to ultraviolet light in the first year will darken the scar. It can take 1-2 years for the scar to lose its redness and to heal completely.  SEEK MEDICAL CARE IF:  You have a fever. SEEK IMMEDIATE  MEDICAL CARE IF:  You have redness, pain, or swelling around the wound.   You see ayellowish-Wence fluid (pus) coming from the wound.    This information is not intended to replace advice given to you by your health care provider. Make sure you discuss any questions you have with your health care provider.   Document Released: 01/05/2005 Document Revised: 12/19/2014 Document Reviewed: 07/11/2013 Elsevier Interactive Patient Education 2016 [employer] Nasal Fracture A nasal fracture is a break or crack in the bones or cartilage of the nose. Minor breaks do not require treatment. These breaks usually heal on their own after about one month. Serious breaks may require surgery. CAUSES This injury is usually caused by a blunt injury to the nose. This type of injury often occurs from:  Contact sports.  Car accidents.  Falls.  Getting punched. SYMPTOMS Symptoms of this injury include:  Pain.  Swelling of the nose.  Bleeding from the nose.  Bruising around the nose or eyes. This may include having black eyes.  Crooked appearance of the nose. DIAGNOSIS This injury may be diagnosed with a physical exam. The health care provider will gently feel the nose for signs of broken bones. He or she will look inside the nostrils to make sure that there is not a blood-filled swelling on the dividing wall between the nostrils (septal hematoma). X-rays of the nose may not show a nasal fracture even when one is present. In some cases, X-rays or a CT scan may be done 1-5 days after the injury. Sometimes, the health care provider will want to wait until the swelling has gone down. TREATMENT Often, minor fractures that have caused no deformity do not require treatment. More serious fractures in which bones have moved out of position may require surgery, which will take place after the swelling is gone. Surgery will stabilize and align the fracture. In some cases, a health care provider may be able to  reposition the bones without surgery. This may be done in the health care provider's office after medicine is given to numb the area (local anesthetic). HOME CARE INSTRUCTIONS  If directed, apply ice to the injured area:  Put ice in a plastic bag.  Place a towel between your skin and the bag.  Leave the ice on for 20 minutes, 2-3 times per day.  Take over-the-counter and prescription medicines only as told by your health care provider.  If your nose starts to bleed, sit in an upright position while you squeeze the soft parts of your nose against the dividing wall between your nostrils (septum) for 10 minutes.  Try to avoid blowing your nose.  Return to your normal activities as told by your health care provider. Ask your health care provider what activities are safe for you.  Avoid contact sports for 3-4 weeks or as told by your health care provider.  Keep all  follow-up visits as told by your health care provider. This is important. SEEK MEDICAL CARE IF:  Your pain increases or becomes severe.  You continue to have nosebleeds.  The shape of your nose does not return to normal within 5 days.  You have pus draining out of your nose. SEEK IMMEDIATE MEDICAL CARE IF:  You have bleeding from your nose that does not stop after you pinch your nostrils closed for 20 minutes and keep ice on your nose.  You have clear fluid draining out of your nose.  You notice a grape-like swelling on the septum. This swelling is a collection of blood (hematoma) that must be drained to help prevent infection.  You have difficulty moving your eyes.  You have repeated vomiting.   This information is not intended to replace advice given to you by your health care provider. Make sure you discuss any questions you have with your health care provider.   Document Released: 11/25/2000 Document Revised: 08/19/2015 Document Reviewed: 01/05/2015 Elsevier Interactive Patient Education [employer].

## 2015-09-20 NOTE — ED Notes (Signed)
The pt is c/i tripping and falling over a curb.  She landed face down on the pavement.  She has a  Laceration to her nose.  It was bleeding inside and out .Marland Kitchen She also has pain and abrasion to her lt knee. .  No loc.  She was seen at [facility] had a xray  That showed a nasal fracture.  She is supposed to see ?? Dr Harlow Mares

## 2016-04-26 ENCOUNTER — Encounter: Payer: Self-pay | Admitting: Internal Medicine

## 2017-02-23 DIAGNOSIS — M8589 Other specified disorders of bone density and structure, multiple sites: Secondary | ICD-10-CM | POA: Insufficient documentation

## 2017-02-23 DIAGNOSIS — Z780 Asymptomatic menopausal state: Secondary | ICD-10-CM | POA: Insufficient documentation

## 2017-02-23 DIAGNOSIS — R635 Abnormal weight gain: Secondary | ICD-10-CM | POA: Insufficient documentation

## 2017-02-23 DIAGNOSIS — N951 Menopausal and female climacteric states: Secondary | ICD-10-CM | POA: Insufficient documentation

## 2017-02-23 DIAGNOSIS — Z0000 Encounter for general adult medical examination without abnormal findings: Secondary | ICD-10-CM | POA: Insufficient documentation

## 2017-02-23 DIAGNOSIS — K635 Polyp of colon: Secondary | ICD-10-CM | POA: Insufficient documentation

## 2017-10-09 ENCOUNTER — Encounter: Payer: Self-pay | Admitting: Internal Medicine

## 2017-12-26 ENCOUNTER — Encounter: Payer: Medicare Other | Admitting: Internal Medicine

## 2019-01-09 ENCOUNTER — Other Ambulatory Visit: Payer: Self-pay | Admitting: Obstetrics and Gynecology

## 2019-01-09 DIAGNOSIS — Z1231 Encounter for screening mammogram for malignant neoplasm of breast: Secondary | ICD-10-CM

## 2019-01-10 ENCOUNTER — Ambulatory Visit: Payer: Medicare Other

## 2019-02-06 ENCOUNTER — Ambulatory Visit
Admission: RE | Admit: 2019-02-06 | Discharge: 2019-02-06 | Disposition: A | Discharge status: Home / Self Care | Payer: Medicare Other | Source: Ambulatory Visit | Attending: Obstetrics and Gynecology | Admitting: Obstetrics and Gynecology

## 2019-02-06 DIAGNOSIS — Z1231 Encounter for screening mammogram for malignant neoplasm of breast: Secondary | ICD-10-CM

## 2020-01-23 ENCOUNTER — Ambulatory Visit: Payer: Self-pay

## 2020-06-23 DIAGNOSIS — J302 Other seasonal allergic rhinitis: Secondary | ICD-10-CM | POA: Insufficient documentation

## 2020-06-23 DIAGNOSIS — F411 Generalized anxiety disorder: Secondary | ICD-10-CM | POA: Insufficient documentation

## 2020-07-21 ENCOUNTER — Other Ambulatory Visit: Payer: Self-pay | Admitting: Family Medicine

## 2020-07-21 DIAGNOSIS — Z1231 Encounter for screening mammogram for malignant neoplasm of breast: Secondary | ICD-10-CM

## 2020-08-11 ENCOUNTER — Other Ambulatory Visit: Payer: Self-pay

## 2020-08-11 ENCOUNTER — Ambulatory Visit
Admission: RE | Admit: 2020-08-11 | Discharge: 2020-08-11 | Disposition: A | Discharge status: Home / Self Care | Payer: Medicare Other | Source: Ambulatory Visit | Attending: Family Medicine | Admitting: Family Medicine

## 2020-08-11 DIAGNOSIS — Z1231 Encounter for screening mammogram for malignant neoplasm of breast: Secondary | ICD-10-CM

## 2020-08-13 ENCOUNTER — Other Ambulatory Visit: Payer: Self-pay | Admitting: Family Medicine

## 2020-08-13 DIAGNOSIS — R928 Other abnormal and inconclusive findings on diagnostic imaging of breast: Secondary | ICD-10-CM

## 2020-08-27 ENCOUNTER — Ambulatory Visit
Admission: RE | Admit: 2020-08-27 | Discharge: 2020-08-27 | Disposition: A | Discharge status: Home / Self Care | Payer: Medicare Other | Source: Ambulatory Visit | Attending: Family Medicine | Admitting: Family Medicine

## 2020-08-27 ENCOUNTER — Other Ambulatory Visit: Payer: Self-pay

## 2020-08-27 DIAGNOSIS — R928 Other abnormal and inconclusive findings on diagnostic imaging of breast: Secondary | ICD-10-CM

## 2022-04-14 IMAGING — MG DIGITAL SCREENING BILAT W/ TOMO W/ CAD
8 series · 8 of 24 positions shown · non-contrast
Comparison: Previous exam(s).

CLINICAL DATA: Screening.

EXAM:
DIGITAL SCREENING BILATERAL MAMMOGRAM WITH TOMO AND CAD

[L MLO synth-2D]
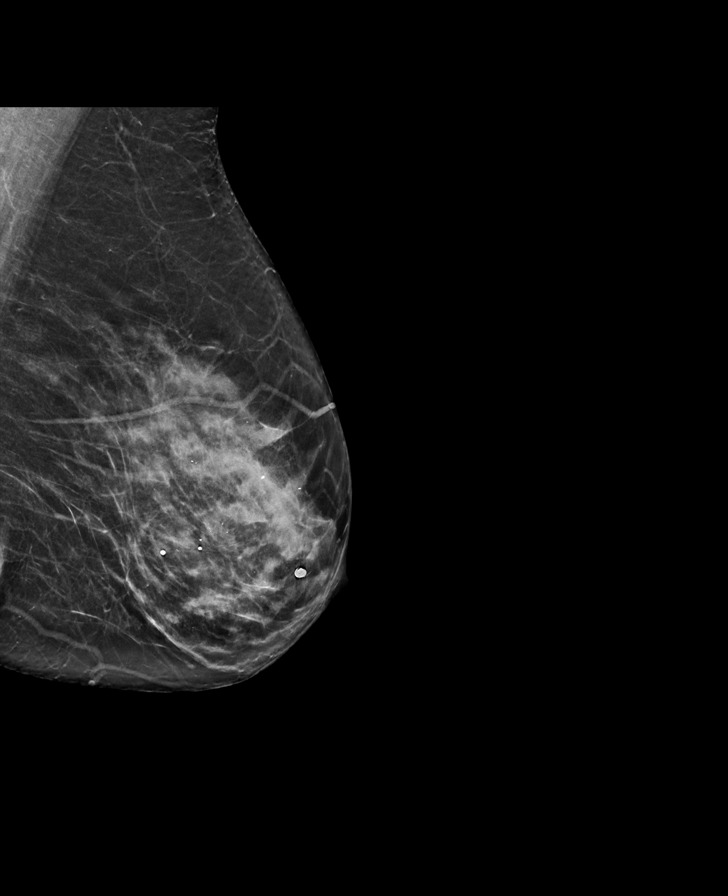

[L CC synth-2D]
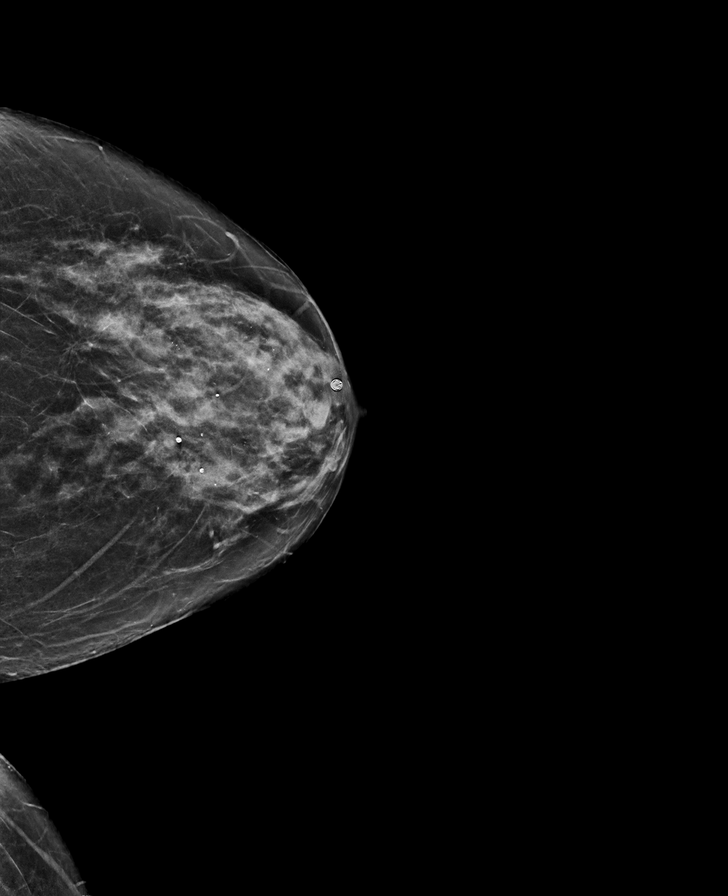

[R CC synth-2D]
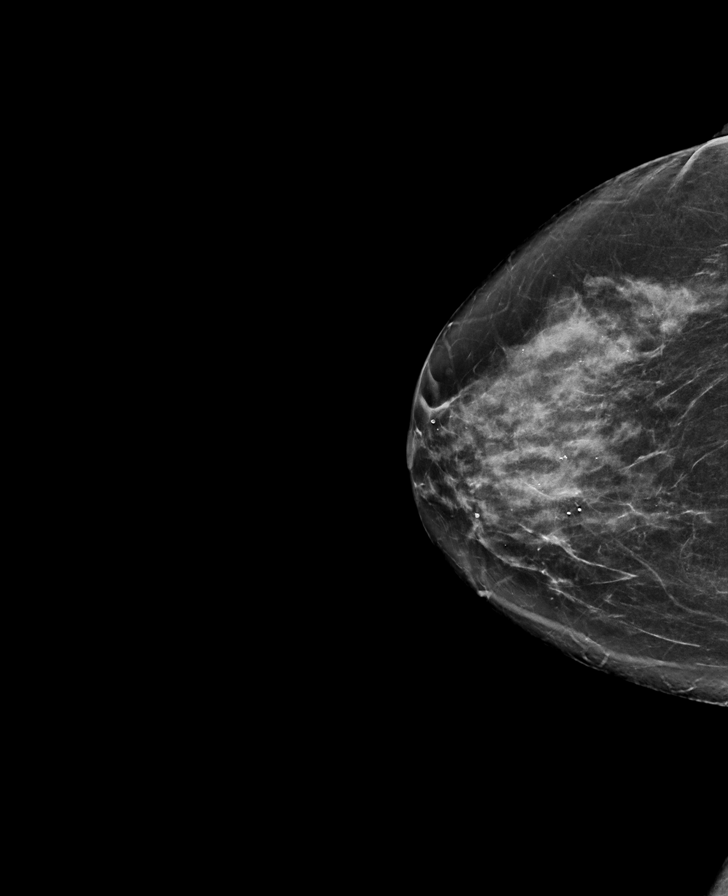

[R MLO synth-2D]
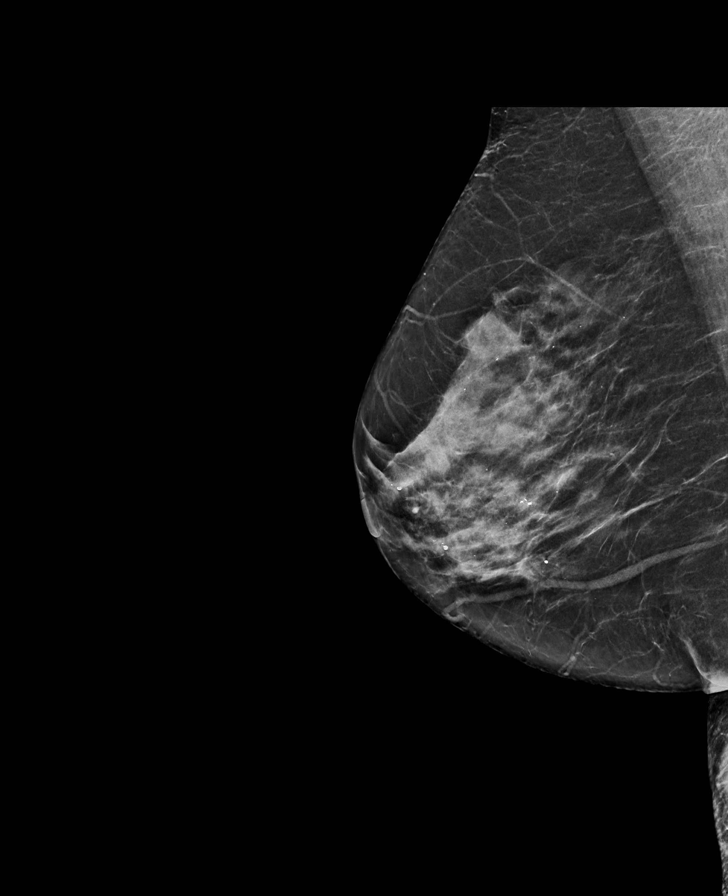

[L CC tomo · tomo slice 31/62.0]
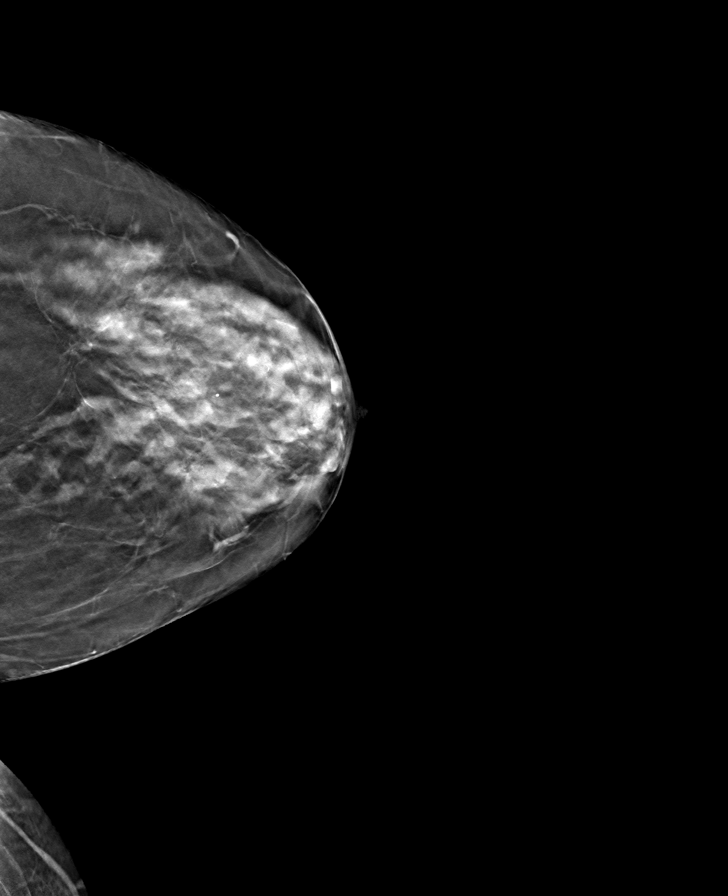

[L MLO tomo · tomo slice 33/66.0]
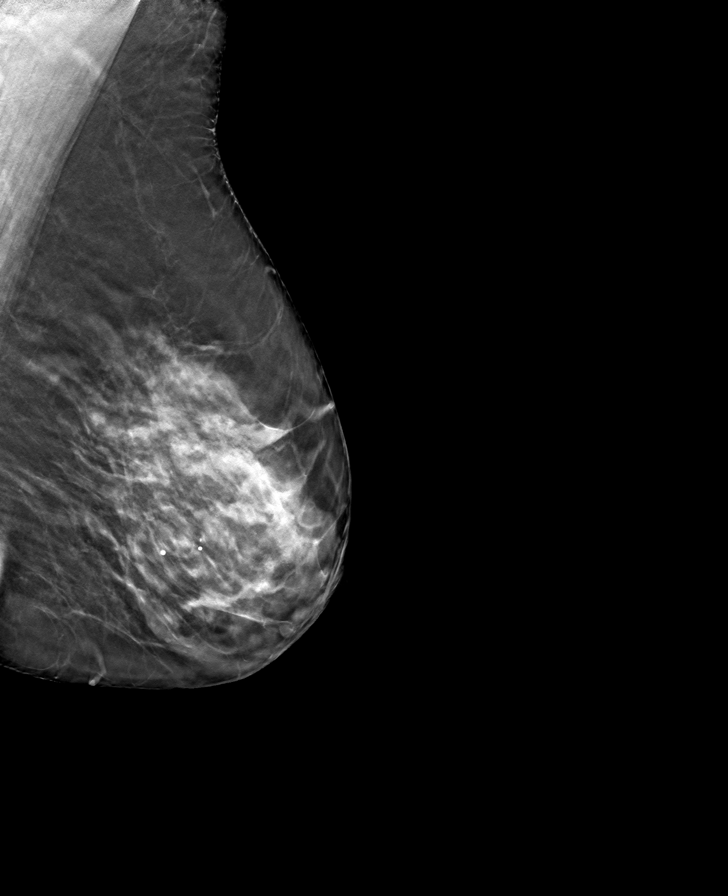

[R MLO tomo · tomo slice 33/66.0]
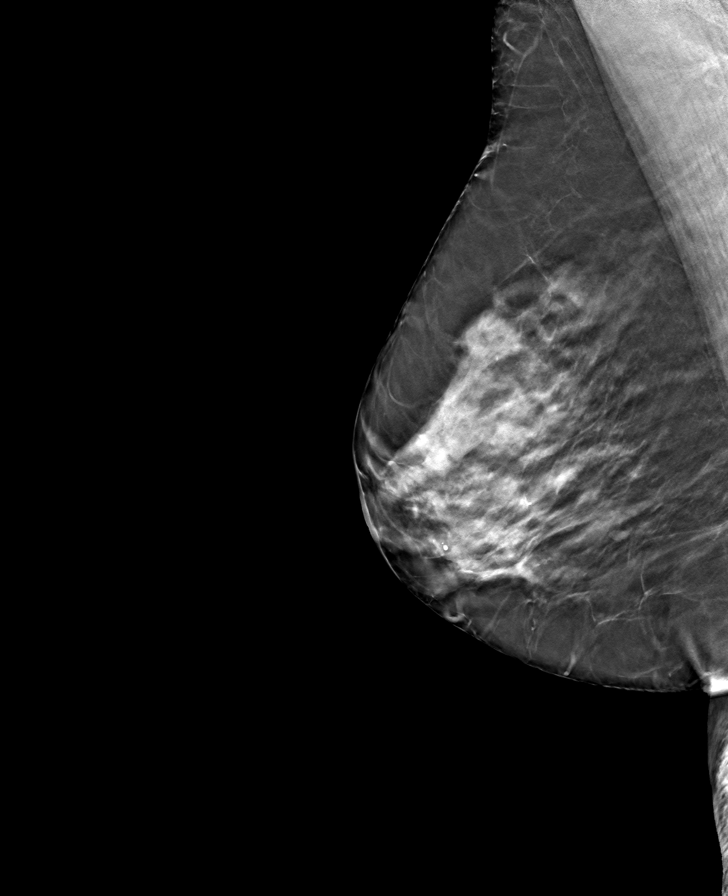

[R CC tomo · tomo slice 35/69.0]
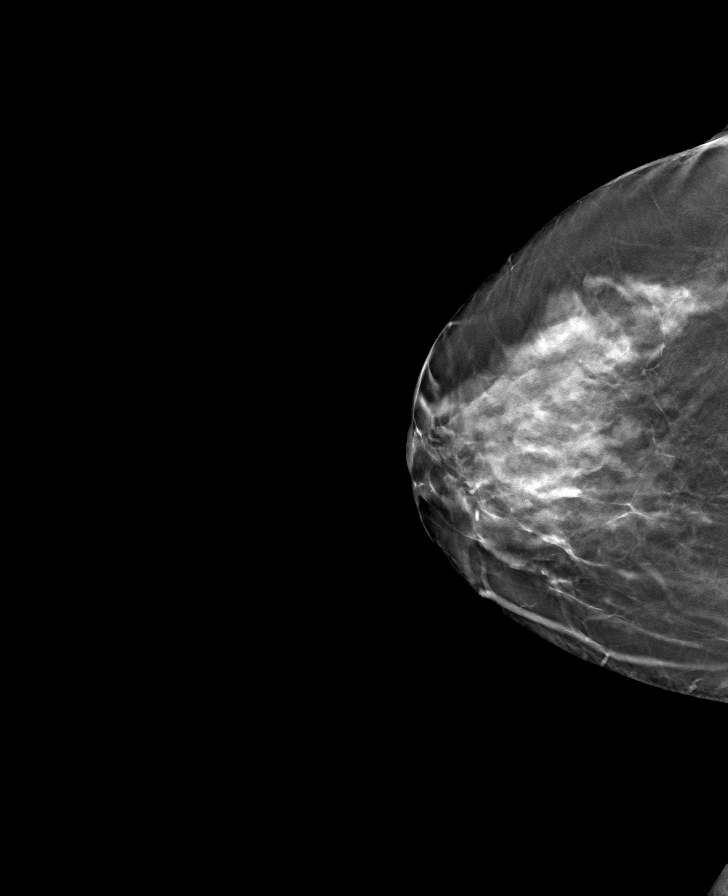

[8 of 24 positions shown; findings below may reference images not displayed]

ACR Breast Density Category c: The breast tissue is heterogeneously
dense, which may obscure small masses.
FINDINGS: In the left breast, 2 sites of possible distortion and a left breast
mass are identified requiring further evaluation. In the right
breast, no findings suspicious for malignancy. Images were processed
with CAD.
IMPRESSION: Further evaluation is suggested for 2 sites of possible left breast
distortion and a left breast mass in the left breast.

RECOMMENDATION:
Diagnostic mammogram and possibly ultrasound of the left breast.
(Code:3H-6-EEC)

The patient will be contacted regarding the findings, and additional
imaging will be scheduled.

BI-RADS CATEGORY  0: Incomplete. Need additional imaging evaluation
and/or prior mammograms for comparison.

## 2022-08-25 ENCOUNTER — Other Ambulatory Visit: Payer: Self-pay | Admitting: Family Medicine

## 2022-08-25 DIAGNOSIS — Z1231 Encounter for screening mammogram for malignant neoplasm of breast: Secondary | ICD-10-CM

## 2022-09-06 ENCOUNTER — Ambulatory Visit: Payer: Medicare Other

## 2022-10-05 ENCOUNTER — Ambulatory Visit
Admission: RE | Admit: 2022-10-05 | Discharge: 2022-10-05 | Disposition: A | Discharge status: Home / Self Care | Payer: Medicare Other | Source: Ambulatory Visit | Attending: Family Medicine | Admitting: Family Medicine

## 2022-10-05 DIAGNOSIS — Z1231 Encounter for screening mammogram for malignant neoplasm of breast: Secondary | ICD-10-CM

## 2023-02-06 ENCOUNTER — Encounter (HOSPITAL_COMMUNITY): Payer: Self-pay | Admitting: Emergency Medicine

## 2023-02-06 ENCOUNTER — Inpatient Hospital Stay (HOSPITAL_COMMUNITY)
Admission: EM | Admit: 2023-02-06 | Discharge: 2023-02-08 | DRG: 378 | Disposition: A | Discharge status: Home / Self Care | Payer: Medicare Other | Attending: Internal Medicine | Admitting: Internal Medicine

## 2023-02-06 ENCOUNTER — Emergency Department (HOSPITAL_COMMUNITY): Payer: Medicare Other

## 2023-02-06 ENCOUNTER — Other Ambulatory Visit: Payer: Self-pay

## 2023-02-06 DIAGNOSIS — R55 Syncope and collapse: Secondary | ICD-10-CM | POA: Diagnosis present

## 2023-02-06 DIAGNOSIS — Y92012 Bathroom of single-family (private) house as the place of occurrence of the external cause: Secondary | ICD-10-CM | POA: Diagnosis not present

## 2023-02-06 DIAGNOSIS — Z79899 Other long term (current) drug therapy: Secondary | ICD-10-CM

## 2023-02-06 DIAGNOSIS — N179 Acute kidney failure, unspecified: Secondary | ICD-10-CM | POA: Diagnosis present

## 2023-02-06 DIAGNOSIS — T39315A Adverse effect of propionic acid derivatives, initial encounter: Secondary | ICD-10-CM | POA: Diagnosis present

## 2023-02-06 DIAGNOSIS — Z8249 Family history of ischemic heart disease and other diseases of the circulatory system: Secondary | ICD-10-CM

## 2023-02-06 DIAGNOSIS — D62 Acute posthemorrhagic anemia: Secondary | ICD-10-CM | POA: Diagnosis present

## 2023-02-06 DIAGNOSIS — R195 Other fecal abnormalities: Secondary | ICD-10-CM | POA: Diagnosis present

## 2023-02-06 DIAGNOSIS — K222 Esophageal obstruction: Secondary | ICD-10-CM

## 2023-02-06 DIAGNOSIS — S2242XA Multiple fractures of ribs, left side, initial encounter for closed fracture: Secondary | ICD-10-CM | POA: Diagnosis present

## 2023-02-06 DIAGNOSIS — K2100 Gastro-esophageal reflux disease with esophagitis, without bleeding: Secondary | ICD-10-CM | POA: Diagnosis present

## 2023-02-06 DIAGNOSIS — E876 Hypokalemia: Secondary | ICD-10-CM | POA: Diagnosis present

## 2023-02-06 DIAGNOSIS — K3189 Other diseases of stomach and duodenum: Secondary | ICD-10-CM | POA: Diagnosis present

## 2023-02-06 DIAGNOSIS — Z800 Family history of malignant neoplasm of digestive organs: Secondary | ICD-10-CM

## 2023-02-06 DIAGNOSIS — K2971 Gastritis, unspecified, with bleeding: Secondary | ICD-10-CM | POA: Diagnosis not present

## 2023-02-06 DIAGNOSIS — D649 Anemia, unspecified: Secondary | ICD-10-CM | POA: Diagnosis not present

## 2023-02-06 DIAGNOSIS — W1830XA Fall on same level, unspecified, initial encounter: Secondary | ICD-10-CM | POA: Diagnosis present

## 2023-02-06 DIAGNOSIS — G2581 Restless legs syndrome: Secondary | ICD-10-CM | POA: Diagnosis present

## 2023-02-06 DIAGNOSIS — K922 Gastrointestinal hemorrhage, unspecified: Secondary | ICD-10-CM | POA: Diagnosis present

## 2023-02-06 DIAGNOSIS — K250 Acute gastric ulcer with hemorrhage: Secondary | ICD-10-CM | POA: Diagnosis present

## 2023-02-06 DIAGNOSIS — D519 Vitamin B12 deficiency anemia, unspecified: Secondary | ICD-10-CM | POA: Diagnosis present

## 2023-02-06 DIAGNOSIS — Z823 Family history of stroke: Secondary | ICD-10-CM

## 2023-02-06 DIAGNOSIS — K209 Esophagitis, unspecified without bleeding: Secondary | ICD-10-CM | POA: Diagnosis present

## 2023-02-06 DIAGNOSIS — K2091 Esophagitis, unspecified with bleeding: Secondary | ICD-10-CM | POA: Diagnosis not present

## 2023-02-06 DIAGNOSIS — K259 Gastric ulcer, unspecified as acute or chronic, without hemorrhage or perforation: Secondary | ICD-10-CM | POA: Diagnosis present

## 2023-02-06 DIAGNOSIS — K449 Diaphragmatic hernia without obstruction or gangrene: Secondary | ICD-10-CM | POA: Diagnosis present

## 2023-02-06 DIAGNOSIS — K264 Chronic or unspecified duodenal ulcer with hemorrhage: Secondary | ICD-10-CM | POA: Diagnosis not present

## 2023-02-06 LAB — CBC
HCT: 21.3 % — ABNORMAL LOW (ref 36.0–46.0)
Hemoglobin: 7.2 g/dL — ABNORMAL LOW (ref 12.0–15.0)
MCH: 33.5 pg (ref 26.0–34.0)
MCHC: 33.8 g/dL (ref 30.0–36.0)
MCV: 99.1 fL (ref 80.0–100.0)
Platelets: 179 10*3/uL (ref 150–400)
RBC: 2.15 MIL/uL — ABNORMAL LOW (ref 3.87–5.11)
RDW: 12.8 % (ref 11.5–15.5)
WBC: 10.4 10*3/uL (ref 4.0–10.5)
nRBC: 0 % (ref 0.0–0.2)

## 2023-02-06 LAB — CBC WITH DIFFERENTIAL/PLATELET
Abs Immature Granulocytes: 0.07 10*3/uL (ref 0.00–0.07)
Basophils Absolute: 0 10*3/uL (ref 0.0–0.1)
Basophils Relative: 0 %
Eosinophils Absolute: 0 10*3/uL (ref 0.0–0.5)
Eosinophils Relative: 0 %
HCT: 26 % — ABNORMAL LOW (ref 36.0–46.0)
Hemoglobin: 8.5 g/dL — ABNORMAL LOW (ref 12.0–15.0)
Immature Granulocytes: 1 %
Lymphocytes Relative: 11 %
Lymphs Abs: 1.4 10*3/uL (ref 0.7–4.0)
MCH: 32.4 pg (ref 26.0–34.0)
MCHC: 32.7 g/dL (ref 30.0–36.0)
MCV: 99.2 fL (ref 80.0–100.0)
Monocytes Absolute: 0.7 10*3/uL (ref 0.1–1.0)
Monocytes Relative: 6 %
Neutro Abs: 10.5 10*3/uL — ABNORMAL HIGH (ref 1.7–7.7)
Neutrophils Relative %: 82 %
Platelets: 238 10*3/uL (ref 150–400)
RBC: 2.62 MIL/uL — ABNORMAL LOW (ref 3.87–5.11)
RDW: 12.9 % (ref 11.5–15.5)
WBC: 12.7 10*3/uL — ABNORMAL HIGH (ref 4.0–10.5)
nRBC: 0 % (ref 0.0–0.2)

## 2023-02-06 LAB — COMPREHENSIVE METABOLIC PANEL
ALT: 16 U/L (ref 0–44)
AST: 22 U/L (ref 15–41)
Albumin: 3.8 g/dL (ref 3.5–5.0)
Alkaline Phosphatase: 51 U/L (ref 38–126)
Anion gap: 6 (ref 5–15)
BUN: 36 mg/dL — ABNORMAL HIGH (ref 8–23)
CO2: 22 mmol/L (ref 22–32)
Calcium: 8.6 mg/dL — ABNORMAL LOW (ref 8.9–10.3)
Chloride: 105 mmol/L (ref 98–111)
Creatinine, Ser: 1.08 mg/dL — ABNORMAL HIGH (ref 0.44–1.00)
GFR, Estimated: 53 mL/min — ABNORMAL LOW (ref >60)
Glucose, Bld: 148 mg/dL — ABNORMAL HIGH (ref 70–99)
Potassium: 3.3 mmol/L — ABNORMAL LOW (ref 3.5–5.1)
Sodium: 133 mmol/L — ABNORMAL LOW (ref 135–145)
Total Bilirubin: 0.7 mg/dL (ref 0.3–1.2)
Total Protein: 6.4 g/dL — ABNORMAL LOW (ref 6.5–8.1)

## 2023-02-06 LAB — PROTIME-INR
INR: 1.1 (ref 0.8–1.2)
Prothrombin Time: 14 seconds (ref 11.4–15.2)

## 2023-02-06 LAB — TROPONIN I (HIGH SENSITIVITY): Troponin I (High Sensitivity): 6 ng/L (ref <18)

## 2023-02-06 LAB — POC OCCULT BLOOD, ED: Fecal Occult Bld: POSITIVE — AB

## 2023-02-06 MED ORDER — IOHEXOL 300 MG/ML  SOLN
100.0000 mL | Freq: Once | INTRAMUSCULAR | Status: AC | PRN
  Administered 2023-02-06: 100 mL via INTRAVENOUS

## 2023-02-06 MED ORDER — ONDANSETRON HCL 4 MG/2ML IJ SOLN
4.0000 mg | Freq: Four times a day (QID) | INTRAMUSCULAR | Status: DC | PRN
  Administered 2023-02-07: 4 mg via INTRAVENOUS
  Filled 2023-02-06: qty 2

## 2023-02-06 MED ORDER — PANTOPRAZOLE SODIUM 40 MG IV SOLR: 40.0000 mg | Freq: Two times a day (BID) | INTRAVENOUS | Status: DC

## 2023-02-06 MED ORDER — SODIUM CHLORIDE 0.9 % IV BOLUS
1000.0000 mL | Freq: Once | INTRAVENOUS | Status: AC
  Administered 2023-02-06: 1000 mL via INTRAVENOUS

## 2023-02-06 MED ORDER — SODIUM CHLORIDE 0.9 % IV SOLN: INTRAVENOUS | Status: DC

## 2023-02-06 MED ORDER — ESTRADIOL 0.5 MG PO TABS: 2.0000 mg | ORAL_TABLET | Freq: Every day | ORAL | Status: DC

## 2023-02-06 MED ORDER — BISACODYL 5 MG PO TBEC: 5.0000 mg | DELAYED_RELEASE_TABLET | Freq: Every day | ORAL | Status: DC | PRN

## 2023-02-06 MED ORDER — SENNOSIDES-DOCUSATE SODIUM 8.6-50 MG PO TABS: 1.0000 | ORAL_TABLET | Freq: Every evening | ORAL | Status: DC | PRN

## 2023-02-06 MED ORDER — HYDROCODONE-ACETAMINOPHEN 5-325 MG PO TABS: 1.0000 | ORAL_TABLET | Freq: Four times a day (QID) | ORAL | Status: DC | PRN

## 2023-02-06 MED ORDER — ACETAMINOPHEN 650 MG RE SUPP: 650.0000 mg | Freq: Four times a day (QID) | RECTAL | Status: DC | PRN

## 2023-02-06 MED ORDER — PANTOPRAZOLE INFUSION (NEW) - SIMPLE MED
8.0000 mg/h | INTRAVENOUS | Status: DC
  Administered 2023-02-06: 8 mg/h via INTRAVENOUS
  Filled 2023-02-06: qty 100
  Filled 2023-02-06: qty 80

## 2023-02-06 MED ORDER — HYDRALAZINE HCL 20 MG/ML IJ SOLN: 5.0000 mg | Freq: Four times a day (QID) | INTRAMUSCULAR | Status: DC | PRN

## 2023-02-06 MED ORDER — OXYCODONE-ACETAMINOPHEN 5-325 MG PO TABS
1.0000 | ORAL_TABLET | Freq: Four times a day (QID) | ORAL | Status: DC | PRN
  Administered 2023-02-06 – 2023-02-08 (×6): 1 via ORAL
  Filled 2023-02-06 (×6): qty 1

## 2023-02-06 MED ORDER — SODIUM CHLORIDE 0.9% IV SOLUTION: Freq: Once | INTRAVENOUS | Status: AC

## 2023-02-06 MED ORDER — SODIUM CHLORIDE 0.9% FLUSH
3.0000 mL | Freq: Two times a day (BID) | INTRAVENOUS | Status: DC
  Administered 2023-02-06 – 2023-02-08 (×4): 3 mL via INTRAVENOUS

## 2023-02-06 MED ORDER — MORPHINE SULFATE (PF) 2 MG/ML IV SOLN
1.0000 mg | Freq: Four times a day (QID) | INTRAVENOUS | Status: DC | PRN
  Administered 2023-02-07: 1 mg via INTRAVENOUS
  Filled 2023-02-06: qty 1

## 2023-02-06 MED ORDER — PANTOPRAZOLE 80MG IVPB - SIMPLE MED
80.0000 mg | Freq: Once | INTRAVENOUS | Status: AC
  Administered 2023-02-06: 80 mg via INTRAVENOUS
  Filled 2023-02-06: qty 80

## 2023-02-06 MED ORDER — ACETAMINOPHEN 325 MG PO TABS: 650.0000 mg | ORAL_TABLET | Freq: Four times a day (QID) | ORAL | Status: DC | PRN

## 2023-02-06 MED ORDER — CITALOPRAM HYDROBROMIDE 20 MG PO TABS
20.0000 mg | ORAL_TABLET | Freq: Every day | ORAL | Status: DC
  Administered 2023-02-07 (×2): 20 mg via ORAL
  Filled 2023-02-06 (×2): qty 1

## 2023-02-06 MED ORDER — ONDANSETRON HCL 4 MG PO TABS: 4.0000 mg | ORAL_TABLET | Freq: Four times a day (QID) | ORAL | Status: DC | PRN

## 2023-02-06 NOTE — H&P (Signed)
History and Physical   [facility] [place] Admission History and Physical [employer], D.O.    Patient Name: Dana Waters MR#: [id number] Date of Birth: 1946-06-18 Date of Admission: 02/06/2023  Referring MD/NP/PA: Dr. Langston Masker Primary Care Physician: Robyne Peers, MD  Chief Complaint:  Chief Complaint  Patient presents with   Broken ribs    HPI: Dana Waters is a 77 y.o. female with a known history of OA, restless legs presents to the emergency department for evaluation of syncope.  Patient was in a usual state of health until 3 days ago when she reports feeling generalized weakness and fatigue associate with nausea and black vomiting times several episodes.  1 day prior to arrival in the emergency department she had some dizziness and lightheadedness which caused her to fall into a dresser landing on her left side.  She was seen in urgent care and was diagnosed with 4 broken ribs..  Also of note she reports black stools.  She does take NSAIDs regularly (2-4 per day) for OA knee pain.    Otherwise there has been no change in status. Patient has been taking medication as prescribed and there has been no recent change in medication or diet.  No recent antibiotics.  There has been no recent illness, hospitalizations, travel or sick contacts.    EMS/ED Course: Patient received Protonix, Percocet. Medical admission has been requested for further management of upper GI bleed, near syncope.  Review of Systems:  CONSTITUTIONAL: Positive fatigue, weakness, dizziness, lightheadedness no fever/chills, weight gain/loss, headache. EYES: No blurry or double vision. ENT: No tinnitus, postnasal drip, redness or soreness of the oropharynx. RESPIRATORY: No cough, dyspnea, wheeze.  No hemoptysis.  CARDIOVASCULAR: Positive left chest wall pain pain, negative palpitations, syncope, orthopnea. No lower extremity edema.  GASTROINTESTINAL: Positive nausea, vomiting, hematemesis  and melena GENITOURINARY: No dysuria, frequency, hematuria. ENDOCRINE: No polyuria or nocturia. No heat or cold intolerance. HEMATOLOGY: No anemia, bruising, bleeding. INTEGUMENTARY: No rashes, ulcers, lesions. MUSCULOSKELETAL: No arthritis, gout. NEUROLOGIC: No numbness, tingling, ataxia, seizure-type activity, weakness. PSYCHIATRIC: No anxiety, depression, insomnia.   History reviewed. No pertinent past medical history.  Past Surgical History:  Procedure Laterality Date   ABDOMINAL HYSTERECTOMY     TONSILLECTOMY       reports that she has never smoked. She has never used smokeless tobacco. She reports current alcohol use of about 3.0 standard drinks of alcohol per week. She reports that she does not use drugs.  No Known Allergies  Family History  Problem Relation Age of Onset   Hypertension Mother    Stroke Mother    Hypertension Father    Hypertension Sister    Colon cancer Paternal Aunt     Prior to Admission medications   Medication Sig Start Date End Date Taking? Authorizing Provider  amoxicillin-clavulanate (AUGMENTIN) 875-125 MG tablet Take 1 tablet by mouth every 12 (twelve) hours. 09/20/15   Little, Wenda Overland, MD  CALCIUM-VITAMIN D PO Take 1 tablet by mouth daily.    [provider]  citalopram (CELEXA) 20 MG tablet Take 20 mg by mouth at bedtime.     [provider]  estradiol (ESTRACE) 2 MG tablet Take 2 mg by mouth daily.    [provider]  HYDROcodone-acetaminophen (NORCO/VICODIN) 5-325 MG tablet Take 1 tablet by mouth every 6 (six) hours as needed. 09/20/15   Orma Flaming, MD  ibuprofen (ADVIL,MOTRIN) 200 MG tablet Take 200 mg by mouth every 8 (eight) hours as  needed for moderate pain.     [provider]  Multiple Vitamins-Minerals (ALIVE WOMENS 50+) TABS Take 1 tablet by mouth daily as needed (when remembers).    [provider]  oxyCODONE-acetaminophen (PERCOCET) 5-325 MG tablet Take 1 tablet by mouth every 6  (six) hours as needed for severe pain. 09/20/15   Little, Wenda Overland, MD    Physical Exam: Vitals:   02/06/23 1409 02/06/23 1737 02/06/23 1740 02/06/23 1741  BP:  (!) 151/71    Pulse:  97 97   Resp:  19 20   Temp:    98.7 F (37.1 C)  TempSrc:    Oral  SpO2:  100% 99%   Weight: 55.8 kg     Height: '5\' 2"'$  (1.575 m)       GENERAL: 77 y.o.-year-old female patient, well-developed, well-nourished lying in the bed in no acute distress.  Pleasant and cooperative.   HEENT: Head atraumatic, normocephalic. Pale NECK: Supple. No JVD. CHEST: Normal breath sounds bilaterally. No wheezing, rales, rhonchi or crackles. No use of accessory muscles of respiration.  Reproducible left sided chest wall tenderness.  CARDIOVASCULAR: S1, S2 normal. No murmurs, rubs, or gallops. Cap refill <2 seconds. Pulses intact distally.  ABDOMEN: Soft, nondistended, nontender. No rebound, guarding, rigidity. Normoactive bowel sounds present in all four quadrants.  EXTREMITIES: No pedal edema, cyanosis, or clubbing. No calf tenderness or Homan's sign.  NEUROLOGIC: The patient is alert and oriented x 3. Cranial nerves II through XII are grossly intact with no focal sensorimotor deficit. PSYCHIATRIC:  Normal affect, mood, thought content. SKIN: Warm, dry, and intact without obvious rash, lesion, or ulcer.    Labs on Admission:  CBC: Recent Labs  Lab 02/06/23 1439  WBC 12.7*  NEUTROABS 10.5*  HGB 8.5*  HCT 26.0*  MCV 99.2  PLT 99991111   Basic Metabolic Panel: Recent Labs  Lab 02/06/23 1439  NA 133*  K 3.3*  CL 105  CO2 22  GLUCOSE 148*  BUN 36*  CREATININE 1.08*  CALCIUM 8.6*   GFR: Estimated Creatinine Clearance: 35 mL/min (A) (by C-G formula based on SCr of 1.08 mg/dL (H)). Liver Function Tests: Recent Labs  Lab 02/06/23 1439  AST 22  ALT 16  ALKPHOS 51  BILITOT 0.7  PROT 6.4*  ALBUMIN 3.8   No results for input(s): "LIPASE", "AMYLASE" in the last 168 hours. No results for input(s):  "AMMONIA" in the last 168 hours. Coagulation Profile: Recent Labs  Lab 02/06/23 1439  INR 1.1   Cardiac Enzymes: No results for input(s): "CKTOTAL", "CKMB", "CKMBINDEX", "TROPONINI" in the last 168 hours. BNP (last 3 results) No results for input(s): "PROBNP" in the last 8760 hours. HbA1C: No results for input(s): "HGBA1C" in the last 72 hours. CBG: No results for input(s): "GLUCAP" in the last 168 hours. Lipid Profile: No results for input(s): "CHOL", "HDL", "LDLCALC", "TRIG", "CHOLHDL", "LDLDIRECT" in the last 72 hours. Thyroid Function Tests: No results for input(s): "TSH", "T4TOTAL", "FREET4", "T3FREE", "THYROIDAB" in the last 72 hours. Anemia Panel: No results for input(s): "VITAMINB12", "FOLATE", "FERRITIN", "TIBC", "IRON", "RETICCTPCT" in the last 72 hours. Urine analysis: No results found for: "COLORURINE", "APPEARANCEUR", "LABSPEC", "PHURINE", "GLUCOSEU", "HGBUR", "BILIRUBINUR", "KETONESUR", "PROTEINUR", "UROBILINOGEN", "NITRITE", "LEUKOCYTESUR" Sepsis Labs: '@LABRCNTIP'$ (procalcitonin:4,lacticidven:4) )No results found for this or any previous visit (from the past 240 hour(s)).   Radiological Exams on Admission: CT Chest W Contrast  Result Date: 02/06/2023 CLINICAL DATA:  Chest trauma.  Fall.  Rib pain. EXAM: CT CHEST, ABDOMEN, AND PELVIS WITH CONTRAST  TECHNIQUE: Multidetector CT imaging of the chest, abdomen and pelvis was performed following the standard protocol during bolus administration of intravenous contrast. RADIATION DOSE REDUCTION: This exam was performed according to the departmental dose-optimization program which includes automated exposure control, adjustment of the mA and/or kV according to patient size and/or use of iterative reconstruction technique. CONTRAST:  161m OMNIPAQUE IOHEXOL 300 MG/ML  SOLN COMPARISON:  None Available. FINDINGS: CT CHEST FINDINGS Cardiovascular: No evidence of aortic dissection or transsection. No pericardial fluid. Mediastinum/Nodes:  Trachea and esophagus normal. No mediastinal hematoma. Lungs/Pleura: No pneumothorax. No pulmonary contusion. Scattered atelectasis. Musculoskeletal: Minimally displaced fractures of the fifth, sixth, and seventh LEFT ribs laterally (image 62/series 507, for example). No pneumothorax associated with the rib fractures. CT ABDOMEN AND PELVIS FINDINGS Hepatobiliary: No focal hepatic lesion. No biliary ductal dilatation. Gallbladder is normal. Common bile duct is normal. Pancreas: Pancreas is normal. No ductal dilatation. No pancreatic inflammation. Spleen: Normal spleen Adrenals/urinary tract: Kidneys enhance symmetrically. Bladder intact Stomach/Bowel: Small hiatal hernia. Duodenum normal. No evidence of bowel injury. No fluid in the mesentery. Vascular/Lymphatic: No aortic injury Reproductive: Post hysterectomy Other: No free fluid. Musculoskeletal: No pelvic fracture IMPRESSION: CHEST IMPRESSION: 1. Minimally displaced fractures of the LEFT fifth, sixth and seventh ribs. 2. No pneumothorax. 3. No pulmonary contusion. 4. No aortic injury. PELVIS IMPRESSION: 1. No evidence of trauma in the abdomen pelvis. 2. No pelvic fracture. Electronically Signed   By: SSuzy BouchardM.D.   On: 02/06/2023 18:34   CT ABDOMEN PELVIS W CONTRAST  Result Date: 02/06/2023 CLINICAL DATA:  Chest trauma.  Fall.  Rib pain. EXAM: CT CHEST, ABDOMEN, AND PELVIS WITH CONTRAST TECHNIQUE: Multidetector CT imaging of the chest, abdomen and pelvis was performed following the standard protocol during bolus administration of intravenous contrast. RADIATION DOSE REDUCTION: This exam was performed according to the departmental dose-optimization program which includes automated exposure control, adjustment of the mA and/or kV according to patient size and/or use of iterative reconstruction technique. CONTRAST:  1075mOMNIPAQUE IOHEXOL 300 MG/ML  SOLN COMPARISON:  None Available. FINDINGS: CT CHEST FINDINGS Cardiovascular: No evidence of aortic  dissection or transsection. No pericardial fluid. Mediastinum/Nodes: Trachea and esophagus normal. No mediastinal hematoma. Lungs/Pleura: No pneumothorax. No pulmonary contusion. Scattered atelectasis. Musculoskeletal: Minimally displaced fractures of the fifth, sixth, and seventh LEFT ribs laterally (image 62/series 507, for example). No pneumothorax associated with the rib fractures. CT ABDOMEN AND PELVIS FINDINGS Hepatobiliary: No focal hepatic lesion. No biliary ductal dilatation. Gallbladder is normal. Common bile duct is normal. Pancreas: Pancreas is normal. No ductal dilatation. No pancreatic inflammation. Spleen: Normal spleen Adrenals/urinary tract: Kidneys enhance symmetrically. Bladder intact Stomach/Bowel: Small hiatal hernia. Duodenum normal. No evidence of bowel injury. No fluid in the mesentery. Vascular/Lymphatic: No aortic injury Reproductive: Post hysterectomy Other: No free fluid. Musculoskeletal: No pelvic fracture IMPRESSION: CHEST IMPRESSION: 1. Minimally displaced fractures of the LEFT fifth, sixth and seventh ribs. 2. No pneumothorax. 3. No pulmonary contusion. 4. No aortic injury. PELVIS IMPRESSION: 1. No evidence of trauma in the abdomen pelvis. 2. No pelvic fracture. Electronically Signed   By: StSuzy Bouchard.D.   On: 02/06/2023 18:34    EKG: Sinus tachycardia at 102 with normal axis, PVCs and nonspecific ST-T wave changes.   Assessment/Plan  This is a [age].o. female with a history of OA, restless legs now being admitted with:  #. Upper GI Bleed likely 2/2 NSAID use -Admit to medicine with telemetry monitoring -IV Protonix '80mg'$  bolus given in ER to be followed  by '8mg'$ /hr -Nothing by mouth -IV fluid hydration -Hold anticoagulants, NSAIDs -GI consultation has been requested of [facility]  #.  Normocytic anemia likely secondary to above -Check iron studies, B12 and folate -Serial CBCs, will type and crossmatch and transfuse 2 units of packed red blood cells  #. Near  syncope likely secondary to above - Ambulate with assistance - Check TSH  #. Mild AKI likely secondary to decreased p.o. intake - IV fluid hydration -Check BMP in a.m.  #.  Left-sided rib fractures -Pain control as needed - Incentive spirometry  Continue Celexa, Ambien at bedtime with fall precautions  Admission status: Inpatient with telemetry monitoring IV Fluids: Normal saline Diet/Nutrition: N.p.o. Consults called: [facility] DVT Px: SCDs and early ambulation. Code Status: Full Code  Disposition Plan: To home in 1-2 days  All the records are reviewed and case discussed with ED provider. Management plans discussed with the patient and/or family who express understanding and agree with plan of care.  Angelea Penny D.O. on 02/06/2023 at 8:02 PM CC: Primary care physician; Robyne Peers, MD   02/06/2023, 8:02 PM

## 2023-02-06 NOTE — ED Provider Triage Note (Signed)
Emergency Medicine Provider Triage Evaluation Note  Dana Waters , a 77 y.o. female  was evaluated in triage.  Pt complains of left rib pain, fall Saturday morning. Reports black emesis that morning, walked to the bedroom where she passed out and woke up on the floor. Went to UC today and told broke 4 ribs.  ?dark stool  Not on thinners Review of Systems  Positive: As above Negative: Vomiting, abdominal pain, fever  Physical Exam  BP (!) 138/59 (BP Location: Left Arm)   Pulse (!) 114   Temp (!) 97.4 F (36.3 C) (Oral)   Resp 18   Ht '5\' 2"'$  (1.575 m)   Wt 55.8 kg   SpO2 100%   BMI 22.50 kg/m  Gen:   Awake, no distress   Resp:  Normal effort  MSK:   Moves extremities without difficulty  Other:    Medical Decision Making  Medically screening exam initiated at 2:25 PM.  Appropriate orders placed.  Dana Waters was informed that the remainder of the evaluation will be completed by another provider, this initial triage assessment does not replace that evaluation, and the importance of remaining in the ED until their evaluation is complete.     Tacy Learn, PA-C 02/06/23 1426

## 2023-02-06 NOTE — ED Provider Notes (Signed)
[facility] Provider Note   CSN: [id number] Arrival date & time: 02/06/23  1357     History  Chief Complaint  Patient presents with   Broken ribs    Dana Waters is a 77 y.o. female presenting to the ED with syncope versus near syncope and chest injury.  Patient reports that on Saturday, 3 days ago, she was feeling generally unwell and fatigued.  She says she felt nauseated and had several episodes of vomiting which was dark brown or black in appearance.  On Sunday, yesterday, in the morning, she tried to get up and began to feel very lightheaded and fell in her bedroom, striking her chest wall on the dresser.  She questions whether she lost consciousness.  She has felt very fatigued and lightheaded since then.  She went to urgent care today where she was referred into the ED for further evaluation.  The patient reports that she noted her stools were darker over the past 24 hours.  She denies anticoagulation use.  She denies any known history of GI bleed or peptic ulcers, although she does suffer from occasional indigestion.  She reports her last colonoscopy was many years ago.  At urgent care today she was told that she may have 4 broken ribs and advised to come to the ED for further workup  HPI     Home Medications Prior to Admission medications   Medication Sig Start Date End Date Taking? Authorizing Provider  amoxicillin-clavulanate (AUGMENTIN) 875-125 MG tablet Take 1 tablet by mouth every 12 (twelve) hours. 09/20/15   Little, Wenda Overland, MD  CALCIUM-VITAMIN D PO Take 1 tablet by mouth daily.    [provider]  citalopram (CELEXA) 20 MG tablet Take 20 mg by mouth at bedtime.     [provider]  estradiol (ESTRACE) 2 MG tablet Take 2 mg by mouth daily.    [provider]  HYDROcodone-acetaminophen (NORCO/VICODIN) 5-325 MG tablet Take 1 tablet by mouth every 6 (six) hours as needed. 09/20/15   Orma Flaming, MD  ibuprofen (ADVIL,MOTRIN) 200 MG tablet Take 200 mg by mouth every 8 (eight) hours as needed for moderate pain.     [provider]  Multiple Vitamins-Minerals (ALIVE WOMENS 50+) TABS Take 1 tablet by mouth daily as needed (when remembers).    [provider]  oxyCODONE-acetaminophen (PERCOCET) 5-325 MG tablet Take 1 tablet by mouth every 6 (six) hours as needed for severe pain. 09/20/15   Little, Wenda Overland, MD      Allergies    Patient has no known allergies.    Review of Systems   Review of Systems  Physical Exam Updated Vital Signs BP (!) 151/71   Pulse 97   Temp 98.7 F (37.1 C) (Oral)   Resp 20   Ht '5\' 2"'$  (1.575 m)   Wt 55.8 kg   SpO2 99%   BMI 22.50 kg/m  Physical Exam Constitutional:      General: She is not in acute distress. HENT:     Head: Normocephalic and atraumatic.  Eyes:     Conjunctiva/sclera: Conjunctivae normal.     Pupils: Pupils are equal, round, and reactive to light.  Cardiovascular:     Rate and Rhythm: Normal rate and regular rhythm.  Pulmonary:     Effort: Pulmonary effort is normal. No respiratory distress.  Abdominal:     General: There is no distension.     Tenderness: There is no  abdominal tenderness.  Musculoskeletal:     Comments: Left-sided chest wall tenderness  Skin:    General: Skin is warm and dry.  Neurological:     General: No focal deficit present.     Mental Status: She is alert. Mental status is at baseline.  Psychiatric:        Mood and Affect: Mood normal.        Behavior: Behavior normal.     ED Results / Procedures / Treatments   Labs (all labs ordered are listed, but only abnormal results are displayed) Labs Reviewed  COMPREHENSIVE METABOLIC PANEL - Abnormal; Notable for the following components:      Result Value   Sodium 133 (*)    Potassium 3.3 (*)    Glucose, Bld 148 (*)    BUN 36 (*)    Creatinine, Ser 1.08 (*)    Calcium 8.6 (*)    Total Protein 6.4 (*)    GFR, Estimated 53  (*)    All other components within normal limits  CBC WITH DIFFERENTIAL/PLATELET - Abnormal; Notable for the following components:   WBC 12.7 (*)    RBC 2.62 (*)    Hemoglobin 8.5 (*)    HCT 26.0 (*)    Neutro Abs 10.5 (*)    All other components within normal limits  POC OCCULT BLOOD, ED - Abnormal; Notable for the following components:   Fecal Occult Bld POSITIVE (*)    All other components within normal limits  PROTIME-INR  TROPONIN I (HIGH SENSITIVITY)    EKG EKG Interpretation  Date/Time:  Monday February 06 2023 14:34:03 EST Ventricular Rate:  102 PR Interval:  178 QRS Duration: 73 QT Interval:  319 QTC Calculation: 416 R Axis:   28 Text Interpretation: Sinus tachycardia Ventricular premature complex Nonspecific repolarization abnormalities Confirmed by Octaviano Glow [phone]) on 02/06/2023 4:59:50 PM  Radiology CT Chest W Contrast  Result Date: 02/06/2023 CLINICAL DATA:  Chest trauma.  Fall.  Rib pain. EXAM: CT CHEST, ABDOMEN, AND PELVIS WITH CONTRAST TECHNIQUE: Multidetector CT imaging of the chest, abdomen and pelvis was performed following the standard protocol during bolus administration of intravenous contrast. RADIATION DOSE REDUCTION: This exam was performed according to the departmental dose-optimization program which includes automated exposure control, adjustment of the mA and/or kV according to patient size and/or use of iterative reconstruction technique. CONTRAST:  115m OMNIPAQUE IOHEXOL 300 MG/ML  SOLN COMPARISON:  None Available. FINDINGS: CT CHEST FINDINGS Cardiovascular: No evidence of aortic dissection or transsection. No pericardial fluid. Mediastinum/Nodes: Trachea and esophagus normal. No mediastinal hematoma. Lungs/Pleura: No pneumothorax. No pulmonary contusion. Scattered atelectasis. Musculoskeletal: Minimally displaced fractures of the fifth, sixth, and seventh LEFT ribs laterally (image 62/series 507, for example). No pneumothorax associated with the  rib fractures. CT ABDOMEN AND PELVIS FINDINGS Hepatobiliary: No focal hepatic lesion. No biliary ductal dilatation. Gallbladder is normal. Common bile duct is normal. Pancreas: Pancreas is normal. No ductal dilatation. No pancreatic inflammation. Spleen: Normal spleen Adrenals/urinary tract: Kidneys enhance symmetrically. Bladder intact Stomach/Bowel: Small hiatal hernia. Duodenum normal. No evidence of bowel injury. No fluid in the mesentery. Vascular/Lymphatic: No aortic injury Reproductive: Post hysterectomy Other: No free fluid. Musculoskeletal: No pelvic fracture IMPRESSION: CHEST IMPRESSION: 1. Minimally displaced fractures of the LEFT fifth, sixth and seventh ribs. 2. No pneumothorax. 3. No pulmonary contusion. 4. No aortic injury. PELVIS IMPRESSION: 1. No evidence of trauma in the abdomen pelvis. 2. No pelvic fracture. Electronically Signed   By: SSuzy BouchardM.D.   On:  02/06/2023 18:34   CT ABDOMEN PELVIS W CONTRAST  Result Date: 02/06/2023 CLINICAL DATA:  Chest trauma.  Fall.  Rib pain. EXAM: CT CHEST, ABDOMEN, AND PELVIS WITH CONTRAST TECHNIQUE: Multidetector CT imaging of the chest, abdomen and pelvis was performed following the standard protocol during bolus administration of intravenous contrast. RADIATION DOSE REDUCTION: This exam was performed according to the departmental dose-optimization program which includes automated exposure control, adjustment of the mA and/or kV according to patient size and/or use of iterative reconstruction technique. CONTRAST:  13m OMNIPAQUE IOHEXOL 300 MG/ML  SOLN COMPARISON:  None Available. FINDINGS: CT CHEST FINDINGS Cardiovascular: No evidence of aortic dissection or transsection. No pericardial fluid. Mediastinum/Nodes: Trachea and esophagus normal. No mediastinal hematoma. Lungs/Pleura: No pneumothorax. No pulmonary contusion. Scattered atelectasis. Musculoskeletal: Minimally displaced fractures of the fifth, sixth, and seventh LEFT ribs laterally (image  62/series 507, for example). No pneumothorax associated with the rib fractures. CT ABDOMEN AND PELVIS FINDINGS Hepatobiliary: No focal hepatic lesion. No biliary ductal dilatation. Gallbladder is normal. Common bile duct is normal. Pancreas: Pancreas is normal. No ductal dilatation. No pancreatic inflammation. Spleen: Normal spleen Adrenals/urinary tract: Kidneys enhance symmetrically. Bladder intact Stomach/Bowel: Small hiatal hernia. Duodenum normal. No evidence of bowel injury. No fluid in the mesentery. Vascular/Lymphatic: No aortic injury Reproductive: Post hysterectomy Other: No free fluid. Musculoskeletal: No pelvic fracture IMPRESSION: CHEST IMPRESSION: 1. Minimally displaced fractures of the LEFT fifth, sixth and seventh ribs. 2. No pneumothorax. 3. No pulmonary contusion. 4. No aortic injury. PELVIS IMPRESSION: 1. No evidence of trauma in the abdomen pelvis. 2. No pelvic fracture. Electronically Signed   By: SSuzy BouchardM.D.   On: 02/06/2023 18:34    Procedures Procedures    Medications Ordered in ED Medications  oxyCODONE-acetaminophen (PERCOCET/ROXICET) 5-325 MG per tablet 1 tablet (1 tablet Oral Given 02/06/23 1943)  iohexol (OMNIPAQUE) 300 MG/ML solution 100 mL (100 mLs Intravenous Contrast Given 02/06/23 1805)  sodium chloride 0.9 % bolus 1,000 mL (1,000 mLs Intravenous New Bag/Given 02/06/23 1944)    ED Course/ Medical Decision Making/ A&P                             Medical Decision Making Risk Prescription drug management. Decision regarding hospitalization.   This patient presents to the ED with concern for near syncope, chest injury. This involves an extensive number of treatment options, and is a complaint that carries with it a high risk of complications and morbidity.  The differential diagnosis includes arrhythmia for symptomatic anemia versus traumatic bleed versus metabolic derangement versus other  Additional history obtained from patient's husband at  bedside  I ordered and personally interpreted labs.  The pertinent results include: Hemoccult is positive.  BUN and creatinine have some elevation of her baseline.  Hemoglobin is 8.5 today, down from baseline hgb level of 12-13 per chart review (last checked in Sept 2023).  I ordered imaging studies including CT chest abdomen pelvis I independently visualized and interpreted imaging which showed 3 left-sided broken ribs, no other significant traumatic or emergent findings I agree with the radiologist interpretation  The patient was maintained on a cardiac monitor.  I personally viewed and interpreted the cardiac monitored which showed an underlying rhythm of: Sinus rhythm and sinus tachycardia  Per my interpretation the patient's ECG shows sinus tachycardia with no acute ischemic findings  I ordered medication including percocet for chest wall pain; IV fluids for suspected prerenal dehydration with elevated BUN and creatinine.  I have reviewed the patients home medicines and have made adjustments as needed  I requested consultation with the GI,  and discussed lab and imaging findings as well as pertinent plan - they recommend: will evaluate patient in morning, will follow pt   After the interventions noted above, I reevaluated the patient and found that they have: stayed the same   Dispostion:  Patient will be admitted for suspected upper GI bleed which may be secondary to frequent NSAID use.  She is in stable condition at this time and not requiring an emergent blood transfusion.  They will continue to monitor her blood counts in the hospital and GI team will be consulted.  BUN may be elevated due to secondary GI bleed but creatinine is also somewhat elevated and the patient reports she has not been drinking as much water as normal the past few days.  IV fluids ordered.  Patient's husband was also present for the entirety my history and exam and they are in agreement with this  plan.  After consideration of the diagnostic results and the patients response to treatment, I feel that the patent would benefit from admission.         Final Clinical Impression(s) / ED Diagnoses Final diagnoses:  Gastrointestinal hemorrhage, unspecified gastrointestinal hemorrhage type  Near syncope  Closed fracture of multiple ribs of left side, initial encounter    Rx / DC Orders ED Discharge Orders     None         Jeraldean Wechter, Carola Rhine, MD 02/06/23 1944

## 2023-02-06 NOTE — ED Triage Notes (Signed)
Patient arrives ambulatory by POV sent from [facility] with broken ribs. Patient reports walking and feeling dizzy causing her to fall. Patient thinks she hit left side of rib cage on her dresser. Patient sent here for further evaluation of fall.

## 2023-02-07 ENCOUNTER — Inpatient Hospital Stay (HOSPITAL_COMMUNITY): Payer: Medicare Other | Admitting: Certified Registered"

## 2023-02-07 ENCOUNTER — Encounter (HOSPITAL_COMMUNITY)
Admission: EM | Disposition: A | Discharge status: Home / Self Care | Payer: Self-pay | Source: Home / Self Care | Attending: Internal Medicine

## 2023-02-07 ENCOUNTER — Encounter (HOSPITAL_COMMUNITY): Payer: Self-pay | Admitting: Family Medicine

## 2023-02-07 DIAGNOSIS — K2091 Esophagitis, unspecified with bleeding: Secondary | ICD-10-CM

## 2023-02-07 DIAGNOSIS — R195 Other fecal abnormalities: Secondary | ICD-10-CM | POA: Diagnosis not present

## 2023-02-07 DIAGNOSIS — D62 Acute posthemorrhagic anemia: Secondary | ICD-10-CM | POA: Diagnosis not present

## 2023-02-07 DIAGNOSIS — K449 Diaphragmatic hernia without obstruction or gangrene: Secondary | ICD-10-CM

## 2023-02-07 DIAGNOSIS — K922 Gastrointestinal hemorrhage, unspecified: Secondary | ICD-10-CM | POA: Diagnosis not present

## 2023-02-07 DIAGNOSIS — K2971 Gastritis, unspecified, with bleeding: Secondary | ICD-10-CM | POA: Diagnosis not present

## 2023-02-07 DIAGNOSIS — K250 Acute gastric ulcer with hemorrhage: Secondary | ICD-10-CM | POA: Diagnosis not present

## 2023-02-07 DIAGNOSIS — K264 Chronic or unspecified duodenal ulcer with hemorrhage: Secondary | ICD-10-CM

## 2023-02-07 DIAGNOSIS — D649 Anemia, unspecified: Secondary | ICD-10-CM

## 2023-02-07 HISTORY — PX: ESOPHAGOGASTRODUODENOSCOPY (EGD) WITH PROPOFOL: SHX5813

## 2023-02-07 HISTORY — PX: BIOPSY: SHX5522

## 2023-02-07 LAB — TSH: TSH: 2.415 u[IU]/mL (ref 0.350–4.500)

## 2023-02-07 LAB — COMPREHENSIVE METABOLIC PANEL
ALT: 15 U/L (ref 0–44)
AST: 23 U/L (ref 15–41)
Albumin: 3.3 g/dL — ABNORMAL LOW (ref 3.5–5.0)
Alkaline Phosphatase: 44 U/L (ref 38–126)
Anion gap: 4 — ABNORMAL LOW (ref 5–15)
BUN: 15 mg/dL (ref 8–23)
CO2: 20 mmol/L — ABNORMAL LOW (ref 22–32)
Calcium: 7.9 mg/dL — ABNORMAL LOW (ref 8.9–10.3)
Chloride: 113 mmol/L — ABNORMAL HIGH (ref 98–111)
Creatinine, Ser: 0.7 mg/dL (ref 0.44–1.00)
GFR, Estimated: >60 mL/min (ref >60)
Glucose, Bld: 101 mg/dL — ABNORMAL HIGH (ref 70–99)
Potassium: 3.5 mmol/L (ref 3.5–5.1)
Sodium: 137 mmol/L (ref 135–145)
Total Bilirubin: 0.6 mg/dL (ref 0.3–1.2)
Total Protein: 5.3 g/dL — ABNORMAL LOW (ref 6.5–8.1)

## 2023-02-07 LAB — URINALYSIS, ROUTINE W REFLEX MICROSCOPIC
Bacteria, UA: NONE SEEN
Bilirubin Urine: NEGATIVE
Glucose, UA: NEGATIVE mg/dL
Ketones, ur: NEGATIVE mg/dL
Nitrite: NEGATIVE
Protein, ur: NEGATIVE mg/dL
Specific Gravity, Urine: 1.026 (ref 1.005–1.030)
pH: 5 (ref 5.0–8.0)

## 2023-02-07 LAB — ABO/RH: ABO/RH(D): A POS

## 2023-02-07 LAB — CBC
HCT: 28.5 % — ABNORMAL LOW (ref 36.0–46.0)
HCT: 27.9 % — ABNORMAL LOW (ref 36.0–46.0)
Hemoglobin: 9.4 g/dL — ABNORMAL LOW (ref 12.0–15.0)
Hemoglobin: 9.3 g/dL — ABNORMAL LOW (ref 12.0–15.0)
MCH: 32 pg (ref 26.0–34.0)
MCH: 32 pg (ref 26.0–34.0)
MCHC: 33 g/dL (ref 30.0–36.0)
MCHC: 33.3 g/dL (ref 30.0–36.0)
MCV: 96.9 fL (ref 80.0–100.0)
MCV: 95.9 fL (ref 80.0–100.0)
Platelets: 161 10*3/uL (ref 150–400)
Platelets: 163 10*3/uL (ref 150–400)
RBC: 2.94 MIL/uL — ABNORMAL LOW (ref 3.87–5.11)
RBC: 2.91 MIL/uL — ABNORMAL LOW (ref 3.87–5.11)
RDW: 14.3 % (ref 11.5–15.5)
RDW: 14.7 % (ref 11.5–15.5)
WBC: 7.9 10*3/uL (ref 4.0–10.5)
WBC: 7.5 10*3/uL (ref 4.0–10.5)
nRBC: 0 % (ref 0.0–0.2)
nRBC: 0 % (ref 0.0–0.2)

## 2023-02-07 LAB — IRON AND TIBC
Iron: 40 ug/dL (ref 28–170)
Saturation Ratios: 14 % (ref 10.4–31.8)
TIBC: 286 ug/dL (ref 250–450)
UIBC: 246 ug/dL

## 2023-02-07 LAB — FOLATE: Folate: 16.3 ng/mL (ref >5.9)

## 2023-02-07 LAB — PREPARE RBC (CROSSMATCH)

## 2023-02-07 LAB — VITAMIN B12: Vitamin B-12: 233 pg/mL (ref 180–914)

## 2023-02-07 LAB — FERRITIN: Ferritin: 38 ng/mL (ref 11–307)

## 2023-02-07 LAB — MAGNESIUM: Magnesium: 2.1 mg/dL (ref 1.7–2.4)

## 2023-02-07 SURGERY — ESOPHAGOGASTRODUODENOSCOPY (EGD) WITH PROPOFOL
Anesthesia: Monitor Anesthesia Care

## 2023-02-07 MED ORDER — LIDOCAINE 2% (20 MG/ML) 5 ML SYRINGE
INTRAMUSCULAR | Status: DC | PRN
  Administered 2023-02-07: 40 mg via INTRAVENOUS

## 2023-02-07 MED ORDER — POTASSIUM CHLORIDE IN NACL 40-0.9 MEQ/L-% IV SOLN
INTRAVENOUS | Status: DC
  Filled 2023-02-07 (×2): qty 1000

## 2023-02-07 MED ORDER — PROPOFOL 500 MG/50ML IV EMUL
INTRAVENOUS | Status: AC
  Filled 2023-02-07: qty 50

## 2023-02-07 MED ORDER — GABAPENTIN 100 MG PO CAPS
100.0000 mg | ORAL_CAPSULE | Freq: Three times a day (TID) | ORAL | Status: DC
  Administered 2023-02-07 – 2023-02-08 (×4): 100 mg via ORAL
  Filled 2023-02-07 (×4): qty 1

## 2023-02-07 MED ORDER — SODIUM CHLORIDE 0.9% IV SOLUTION: Freq: Once | INTRAVENOUS | Status: DC

## 2023-02-07 MED ORDER — PROPOFOL 10 MG/ML IV BOLUS
INTRAVENOUS | Status: DC | PRN
  Administered 2023-02-07: 20 mg via INTRAVENOUS

## 2023-02-07 MED ORDER — LIDOCAINE 5 % EX PTCH
1.0000 | MEDICATED_PATCH | CUTANEOUS | Status: DC
  Administered 2023-02-07: 1 via TRANSDERMAL
  Filled 2023-02-07 (×2): qty 1

## 2023-02-07 MED ORDER — PANTOPRAZOLE SODIUM 40 MG IV SOLR
40.0000 mg | Freq: Two times a day (BID) | INTRAVENOUS | Status: DC
  Administered 2023-02-07 – 2023-02-08 (×2): 40 mg via INTRAVENOUS
  Filled 2023-02-07 (×2): qty 10

## 2023-02-07 MED ORDER — ZOLPIDEM TARTRATE 5 MG PO TABS
2.5000 mg | ORAL_TABLET | Freq: Every evening | ORAL | Status: DC | PRN
  Administered 2023-02-07: 2.5 mg via ORAL
  Filled 2023-02-07: qty 1

## 2023-02-07 MED ORDER — LACTATED RINGERS IV SOLN
INTRAVENOUS | Status: AC | PRN
  Administered 2023-02-07: 10 mL/h via INTRAVENOUS

## 2023-02-07 MED ORDER — PROPOFOL 500 MG/50ML IV EMUL
INTRAVENOUS | Status: DC | PRN
  Administered 2023-02-07: 125 ug/kg/min via INTRAVENOUS

## 2023-02-07 SURGICAL SUPPLY — 15 items

## 2023-02-07 NOTE — Anesthesia Preprocedure Evaluation (Addendum)
Anesthesia Evaluation  Patient identified by MRN, date of birth, ID band Patient awake    Reviewed: Allergy & Precautions, NPO status , Patient's Chart, lab work & pertinent test results  Airway Mallampati: II  TM Distance: >3 FB Neck ROM: Full    Dental no notable dental hx.    Pulmonary neg pulmonary ROS   Pulmonary exam normal        Cardiovascular negative cardio ROS Normal cardiovascular exam     Neuro/Psych negative neurological ROS  negative psych ROS   GI/Hepatic negative GI ROS,,,(+)     substance abuse    Endo/Other  negative endocrine ROS    Renal/GU negative Renal ROS     Musculoskeletal negative musculoskeletal ROS (+)    Abdominal   Peds  Hematology  (+) Blood dyscrasia, anemia   Anesthesia Other Findings Anemia, heme positive stool  Reproductive/Obstetrics                             Anesthesia Physical Anesthesia Plan  ASA: 2  Anesthesia Plan: MAC   Post-op Pain Management:    Induction: Intravenous  PONV Risk Score and Plan: 2 and Propofol infusion and Treatment may vary due to age or medical condition  Airway Management Planned: Nasal Cannula  Additional Equipment:   Intra-op Plan:   Post-operative Plan:   Informed Consent: I have reviewed the patients History and Physical, chart, labs and discussed the procedure including the risks, benefits and alternatives for the proposed anesthesia with the patient or authorized representative who has indicated his/her understanding and acceptance.     Dental advisory given  Plan Discussed with: CRNA  Anesthesia Plan Comments:        Anesthesia Quick Evaluation

## 2023-02-07 NOTE — Anesthesia Procedure Notes (Signed)
Procedure Name: MAC Date/Time: 02/07/2023 2:10 PM  Performed by: Eben Burow, CRNAPre-anesthesia Checklist: Patient identified, Emergency Drugs available, Suction available, Patient being monitored and Timeout performed Oxygen Delivery Method: Nasal cannula Placement Confirmation: positive ETCO2

## 2023-02-07 NOTE — Consult Note (Addendum)
Referring Provider: Dr. Langston Masker, [place] Primary Care Physician:  Robyne Peers, MD Primary Gastroenterologist:  Dr. Henrene Pastor  Reason for Consultation:  GI bleed  HPI: Dana Waters is a 77 y.o. female with a known history of OA and restless legs who presented to the emergency department at [facility] hospital for evaluation of syncope.  Patient was in a usual state of health until Sunday when she reports waking up around 5 AM and vomiting black material.  Walking back from the bathroom she passed out and hit the dresser, which caused her to have 4 broken ribs.  Hgb 8.5 grams down from 13 grams in September.  Repeat later in the day was 7.2 grams.  She was given a unit of PRBCs and another has been ordered.  Heme positive.  Describes also having dark/black stools on Sunday but no further sign of bleeding recently.  Is on PPI gtt here for now.  Uses NSAIDs in the form of Advil at home for her OA, about 2-3 per day for a long time.  Reports a lot of belching/indigestion at home for which she takes OTC H2 blocker such as pepcid quite regularly.  Leading up to this no other GI issues were present.  Does not see red blood in her stool.  Colonoscopy 04/2011 with Dr. Henrene Pastor:  Diminutive polyp and diverticulosis.  Polyp was a tubular adenoma.   History reviewed. No pertinent past medical history.  Past Surgical History:  Procedure Laterality Date   ABDOMINAL HYSTERECTOMY     TONSILLECTOMY      Prior to Admission medications   Medication Sig Start Date End Date Taking? Authorizing Provider  CALCIUM-VITAMIN D PO Take 1 tablet by mouth daily.   Yes [provider]  citalopram (CELEXA) 20 MG tablet Take 20 mg by mouth at bedtime.    Yes [provider]  gabapentin (NEURONTIN) 100 MG capsule Take 100 mg by mouth 3 (three) times daily.   Yes [provider]  ibuprofen (ADVIL,MOTRIN) 200 MG tablet Take 200 mg by mouth every 8 (eight) hours as needed for moderate pain.    Yes [provider]  Multiple Vitamins-Minerals (ALIVE WOMENS 50+) TABS Take 1 tablet by mouth daily as needed (when remembers).   Yes [provider]  zolpidem (AMBIEN) 5 MG tablet Take 5 mg by mouth at bedtime as needed for sleep.   Yes [provider]  amoxicillin-clavulanate (AUGMENTIN) 875-125 MG tablet Take 1 tablet by mouth every 12 (twelve) hours. Patient not taking: Reported on 02/06/2023 09/20/15   Little, Wenda Overland, MD  erythromycin ophthalmic ointment Place 1 Application into the right eye at bedtime. Patient not taking: Reported on 02/06/2023 12/21/22   [provider]  estradiol (ESTRACE) 2 MG tablet Take 2 mg by mouth daily. Patient not taking: Reported on 02/06/2023    [provider]  HYDROcodone-acetaminophen (NORCO/VICODIN) 5-325 MG tablet Take 1 tablet by mouth every 6 (six) hours as needed. Patient not taking: Reported on 02/06/2023 09/20/15   Orma Flaming, MD  oxyCODONE-acetaminophen (PERCOCET) 5-325 MG tablet Take 1 tablet by mouth every 6 (six) hours as needed for severe pain. Patient not taking: Reported on 02/06/2023 09/20/15   Little, Wenda Overland, MD    Current Facility-Administered Medications  Medication Dose Route Frequency Provider Last Rate Last Admin   0.9 %  sodium chloride infusion (Manually program via Guardrails IV Fluids)   Intravenous Once Hugelmeyer, Alexis, DO       0.9 %  sodium chloride infusion   Intravenous Continuous Hugelmeyer, Alexis, DO 100 mL/hr at 02/06/23 2309 New Bag at 02/06/23 2309   acetaminophen (TYLENOL) tablet 650 mg  650 mg Oral Q6H PRN Hugelmeyer, Alexis, DO       Or   acetaminophen (TYLENOL) suppository 650 mg  650 mg Rectal Q6H PRN Hugelmeyer, Alexis, DO       bisacodyl (DULCOLAX) EC tablet 5 mg  5 mg Oral Daily PRN Hugelmeyer, Alexis, DO       citalopram (CELEXA) tablet 20 mg  20 mg Oral QHS Hugelmeyer, Alexis, DO   20 mg at 02/07/23 0022   gabapentin (NEURONTIN) capsule 100 mg  100 mg Oral TID  Hugelmeyer, Alexis, DO       hydrALAZINE (APRESOLINE) injection 5 mg  5 mg Intravenous Q6H PRN Hugelmeyer, Alexis, DO       HYDROcodone-acetaminophen (NORCO/VICODIN) 5-325 MG per tablet 1 tablet  1 tablet Oral Q6H PRN Hugelmeyer, Alexis, DO       morphine (PF) 2 MG/ML injection 1 mg  1 mg Intravenous Q6H PRN Hugelmeyer, Alexis, DO   1 mg at 02/07/23 0029   ondansetron (ZOFRAN) tablet 4 mg  4 mg Oral Q6H PRN Hugelmeyer, Alexis, DO       Or   ondansetron (ZOFRAN) injection 4 mg  4 mg Intravenous Q6H PRN Hugelmeyer, Alexis, DO   4 mg at 02/07/23 0101   oxyCODONE-acetaminophen (PERCOCET/ROXICET) 5-325 MG per tablet 1 tablet  1 tablet Oral Q6H PRN Wyvonnia Dusky, MD   1 tablet at 02/07/23 0333   [START ON 02/10/2023] pantoprazole (PROTONIX) injection 40 mg  40 mg Intravenous Q12H Wyvonnia Dusky, MD       pantoprozole (PROTONIX) 80 mg /NS 100 mL infusion  8 mg/hr Intravenous Continuous Hugelmeyer, Alexis, DO 10 mL/hr at 02/06/23 2123 8 mg/hr at 02/06/23 2123   senna-docusate (Senokot-S) tablet 1 tablet  1 tablet Oral QHS PRN Hugelmeyer, Alexis, DO       sodium chloride flush (NS) 0.9 % injection 3 mL  3 mL Intravenous Q12H Hugelmeyer, Alexis, DO   3 mL at 02/06/23 2310   zolpidem (AMBIEN) tablet 2.5 mg  2.5 mg Oral QHS PRN Hugelmeyer, Alexis, DO   2.5 mg at 02/07/23 0121    Allergies as of 02/06/2023   (No Known Allergies)    Family History  Problem Relation Age of Onset   Hypertension Mother    Stroke Mother    Hypertension Father    Hypertension Sister    Colon cancer Paternal Aunt     Social History   Socioeconomic History   Marital status: Married    Spouse name: Not on file   Number of children: Not on file   Years of education: Not on file   Highest education level: Not on file  Occupational History   Not on file  Tobacco Use   Smoking status: Never   Smokeless tobacco: Never  Substance and Sexual Activity   Alcohol use: Yes    Alcohol/week: 3.0 standard drinks of  alcohol    Types: 3 drink(s) per week   Drug use: No   Sexual activity: Not on file    Comment: Hysterectomy  Other Topics Concern   Not on file  Social History Narrative   Not on file   Social Determinants of Health   Financial Resource Strain: Not on file  Food Insecurity: No Food Insecurity (02/06/2023)   Hunger Vital Sign    Worried About Running Out  of Food in the Last Year: Never true    [place] in the Last Year: Never true  Transportation Needs: No Transportation Needs (02/06/2023)   PRAPARE - Hydrologist (Medical): No    Lack of Transportation (Non-Medical): No  Physical Activity: Not on file  Stress: Not on file  Social Connections: Not on file  Intimate Partner Violence: Not At Risk (02/06/2023)   Humiliation, Afraid, Rape, and Kick questionnaire    Fear of Current or Ex-Partner: No    Emotionally Abused: No    Physically Abused: No    Sexually Abused: No    Review of Systems: ROS is O/W negative except as mentioned in HPI.  Physical Exam: Vital signs in last 24 hours: Temp:  [97.3 F (36.3 C)-99.3 F (37.4 C)] 97.3 F (36.3 C) (02/27 0558) Pulse Rate:  [86-114] 86 (02/27 0558) Resp:  [16-27] 18 (02/27 0558) BP: (120-151)/(53-71) 126/53 (02/27 0558) SpO2:  [90 %-100 %] 93 % (02/27 0558) Weight:  [55.8 kg] 55.8 kg (02/26 1409) Last BM Date : 02/05/23 General:  Alert, Well-developed, well-nourished, pleasant and cooperative in NAD Head:  Normocephalic and atraumatic. Eyes:  Sclera clear, no icterus.  Conjunctiva pink. Ears:  Normal auditory acuity. Mouth:  No deformity or lesions.   Lungs:  Clear throughout to auscultation.  No wheezes, crackles, or rhonchi.  Heart:  Regular rate and rhythm; no murmurs, clicks, rubs, or gallops. Abdomen:  Soft, non-distended.  BS present.  Non-tender. Rectal:  Deferred.  Heme positive in the ED. Msk:  Symmetrical without gross deformities. Pulses:  Normal pulses noted. Extremities:   Without clubbing or edema. Neurologic:  Alert and oriented x 4;  grossly normal neurologically. Skin:  Intact without significant lesions or rashes. Psych:  Alert and cooperative. Normal mood and affect.  Intake/Output from previous day: 02/26 0701 - 02/27 0700 In: 881.7 [P.O.:20; I.V.:541.7; Blood:320] Out: 150 [Urine:150]  Lab Results: Recent Labs    02/06/23 1439 02/06/23 2316  WBC 12.7* 10.4  HGB 8.5* 7.2*  HCT 26.0* 21.3*  PLT 238 179   BMET Recent Labs    02/06/23 1439  NA 133*  K 3.3*  CL 105  CO2 22  GLUCOSE 148*  BUN 36*  CREATININE 1.08*  CALCIUM 8.6*   LFT Recent Labs    02/06/23 1439  PROT 6.4*  ALBUMIN 3.8  AST 22  ALT 16  ALKPHOS 51  BILITOT 0.7   PT/INR Recent Labs    02/06/23 1439  LABPROT 14.0  INR 1.1   Studies/Results: CT Chest W Contrast  Result Date: 02/06/2023 CLINICAL DATA:  Chest trauma.  Fall.  Rib pain. EXAM: CT CHEST, ABDOMEN, AND PELVIS WITH CONTRAST TECHNIQUE: Multidetector CT imaging of the chest, abdomen and pelvis was performed following the standard protocol during bolus administration of intravenous contrast. RADIATION DOSE REDUCTION: This exam was performed according to the departmental dose-optimization program which includes automated exposure control, adjustment of the mA and/or kV according to patient size and/or use of iterative reconstruction technique. CONTRAST:  162m OMNIPAQUE IOHEXOL 300 MG/ML  SOLN COMPARISON:  None Available. FINDINGS: CT CHEST FINDINGS Cardiovascular: No evidence of aortic dissection or transsection. No pericardial fluid. Mediastinum/Nodes: Trachea and esophagus normal. No mediastinal hematoma. Lungs/Pleura: No pneumothorax. No pulmonary contusion. Scattered atelectasis. Musculoskeletal: Minimally displaced fractures of the fifth, sixth, and seventh LEFT ribs laterally (image 62/series 507, for example). No pneumothorax associated with the rib fractures. CT ABDOMEN AND PELVIS FINDINGS Hepatobiliary:  No  focal hepatic lesion. No biliary ductal dilatation. Gallbladder is normal. Common bile duct is normal. Pancreas: Pancreas is normal. No ductal dilatation. No pancreatic inflammation. Spleen: Normal spleen Adrenals/urinary tract: Kidneys enhance symmetrically. Bladder intact Stomach/Bowel: Small hiatal hernia. Duodenum normal. No evidence of bowel injury. No fluid in the mesentery. Vascular/Lymphatic: No aortic injury Reproductive: Post hysterectomy Other: No free fluid. Musculoskeletal: No pelvic fracture IMPRESSION: CHEST IMPRESSION: 1. Minimally displaced fractures of the LEFT fifth, sixth and seventh ribs. 2. No pneumothorax. 3. No pulmonary contusion. 4. No aortic injury. PELVIS IMPRESSION: 1. No evidence of trauma in the abdomen pelvis. 2. No pelvic fracture. Electronically Signed   By: Suzy Bouchard M.D.   On: 02/06/2023 18:34   CT ABDOMEN PELVIS W CONTRAST  Result Date: 02/06/2023 CLINICAL DATA:  Chest trauma.  Fall.  Rib pain. EXAM: CT CHEST, ABDOMEN, AND PELVIS WITH CONTRAST TECHNIQUE: Multidetector CT imaging of the chest, abdomen and pelvis was performed following the standard protocol during bolus administration of intravenous contrast. RADIATION DOSE REDUCTION: This exam was performed according to the departmental dose-optimization program which includes automated exposure control, adjustment of the mA and/or kV according to patient size and/or use of iterative reconstruction technique. CONTRAST:  127m OMNIPAQUE IOHEXOL 300 MG/ML  SOLN COMPARISON:  None Available. FINDINGS: CT CHEST FINDINGS Cardiovascular: No evidence of aortic dissection or transsection. No pericardial fluid. Mediastinum/Nodes: Trachea and esophagus normal. No mediastinal hematoma. Lungs/Pleura: No pneumothorax. No pulmonary contusion. Scattered atelectasis. Musculoskeletal: Minimally displaced fractures of the fifth, sixth, and seventh LEFT ribs laterally (image 62/series 507, for example). No pneumothorax associated with  the rib fractures. CT ABDOMEN AND PELVIS FINDINGS Hepatobiliary: No focal hepatic lesion. No biliary ductal dilatation. Gallbladder is normal. Common bile duct is normal. Pancreas: Pancreas is normal. No ductal dilatation. No pancreatic inflammation. Spleen: Normal spleen Adrenals/urinary tract: Kidneys enhance symmetrically. Bladder intact Stomach/Bowel: Small hiatal hernia. Duodenum normal. No evidence of bowel injury. No fluid in the mesentery. Vascular/Lymphatic: No aortic injury Reproductive: Post hysterectomy Other: No free fluid. Musculoskeletal: No pelvic fracture IMPRESSION: CHEST IMPRESSION: 1. Minimally displaced fractures of the LEFT fifth, sixth and seventh ribs. 2. No pneumothorax. 3. No pulmonary contusion. 4. No aortic injury. PELVIS IMPRESSION: 1. No evidence of trauma in the abdomen pelvis. 2. No pelvic fracture. Electronically Signed   By: SSuzy BouchardM.D.   On: 02/06/2023 18:34    IMPRESSION:  *Blood loss anemia:  Hgb was 13.3 grams in September, down to 8.5 grams yesterday and then 7.2 grams.  Received a unit of PRBCs and may be receiving another.  Is hemoccult positive.  Suspect UGIB in the setting of NSAID use. *Vitamin B12 deficiency:  Low normal at 233. *NSAID use:  Takes Advil for her OA, 2-3 a day for a long time.  PLAN: -Will plan for EGD with Dr. SFuller Planlater today. -Monitor hemoglobin and transfuse further if needed. -Continue IV PPI for now. -Would recommend starting B12 supplement supplementation as outpatient with PCP.  JLaban Emperor Zehr  02/07/2023, 9:06 AM   Attending Physician Note   I have taken a history, reviewed the chart and examined the patient. I performed more than 50% of this encounter in conjunction with the APP. I agree with the APP's note, impression and recommendations with my edits. My additional impressions and recommendations are as follows.   *Dark stool, heme + stool, elevated BUN - suspected UGI bleed in setting of long term ibuprofen use.  R/O ulcer, erosive gastritis.  *ABL anemia *B12 deficiency. Outpatient mgmt  per PCP  *Continue PPI IV bid *Trend Hgb, transfuse for Hgb < 7 *Avoid NSAIDs *EGD today    Lucio Edward, MD [facility] See AMION, [facility], for our on call provider

## 2023-02-07 NOTE — Op Note (Signed)
[facility] Patient Name: Dana Waters Procedure Date: 02/07/2023 MRN: [id number] Attending MD: Dana Waters , MD, [id number] Date of Birth: [birth year] CSN: [id number] Age: 77 Admit Type: Inpatient Procedure:                Upper GI endoscopy Indications:              Acute post hemorrhagic anemia, Suspected upper                            gastrointestinal bleeding Providers:                Pricilla Riffle. Fuller Plan, MD, Jamison Neighbor RN, RN, Frazier Richards, Technician Referring MD:             [facility] Medicines:                Monitored Anesthesia Care Complications:            No immediate complications. Estimated Blood Loss:     Estimated blood loss was minimal. Procedure:                Pre-Anesthesia Assessment:                           - Prior to the procedure, a History and Physical                            was performed, and patient medications and                            allergies were reviewed. The patient's tolerance of                            previous anesthesia was also reviewed. The risks                            and benefits of the procedure and the sedation                            options and risks were discussed with the patient.                            All questions were answered, and informed consent                            was obtained. Prior Anticoagulants: The patient has                            taken no anticoagulant or antiplatelet agents. ASA                            Grade Assessment: II - A patient with mild systemic  disease. After reviewing the risks and benefits,                            the patient was deemed in satisfactory condition to                            undergo the procedure.                           After obtaining informed consent, the endoscope was                            passed under direct vision. Throughout the                            procedure,  the patient's blood pressure, pulse, and                            oxygen saturations were monitored continuously. The                            [id number] Olympus endoscope was introduced                            through the mouth, and advanced to the second part                            of duodenum. The upper GI endoscopy was                            accomplished without difficulty. The patient                            tolerated the procedure well. Scope In: Scope Out: Findings:      LA Grade B (one or more mucosal breaks greater than 5 mm, not extending       between the tops of two mucosal folds) esophagitis with no bleeding was       found at the gastroesophageal junction.      One benign-appearing, intrinsic mild stenosis was found at the       gastroesophageal junction. This stenosis measured 1.5 cm (inner       diameter) x less than one cm (in length). The stenosis was traversed.      The exam of the esophagus was otherwise normal.      A small hiatal hernia was present.      Two non-bleeding cratered gastric ulcers with clean ulcer bases (Forrest       Class III) were found on the greater curvature of the gastric antrum.       The largest lesion was 6 mm in largest dimension. Biopsies were taken       with a cold forceps for histology.      Multiple dispersed small erosions with no bleeding and no stigmata of       recent bleeding were found in the gastric antrum.      The exam of the stomach was otherwise  normal.      The duodenal bulb and second portion of the duodenum were normal. Impression:               - LA Grade B reflux esophagitis with no bleeding.                           - Benign-appearing esophageal stenosis.                           - Small hiatal hernia.                           - Two non-bleeding gastric ulcers with clean ulcer                            bases (Forrest Class III). Biopsied.                           - Erosive gastropathy  with no bleeding and no                            stigmata of recent bleeding.                           - Normal duodenal bulb and second portion of the                            duodenum. Moderate Sedation:      Not Applicable - Patient had care per Anesthesia. Recommendation:           - Return patient to hospital ward for ongoing care.                           - Resume regular diet today.                           - Follow antireflux measures long term.                           - Continue present medications.                           - Change pantoprazole to 40 mg po qd at discharge.                           - Await pathology results.                           - Avoid NSAIDs.                           - Consider surveillance colonoscopy and repeat EGD                            in 2-3 month to assess ulcer healing - defer  decisions to Dr. Scarlette Shorts.                           - Return to GI office in 1 month with [name],                            PA-C or Dr. Scarlette Shorts.                           - If Hgb stable should be ok for discharge tomorrow.                           - GI signing off. Available if needed. Procedure Code(s):        --- Professional ---                           [phone], Esophagogastroduodenoscopy, flexible,                            transoral; with biopsy, single or multiple Diagnosis Code(s):        --- Professional ---                           K21.00, Gastro-esophageal reflux disease with                            esophagitis, without bleeding                           K22.2, Esophageal obstruction                           K44.9, Diaphragmatic hernia without obstruction or                            gangrene                           K25.9, Gastric ulcer, unspecified as acute or                            chronic, without hemorrhage or perforation                           K31.89, Other diseases of stomach and  duodenum                           D62, Acute posthemorrhagic anemia CPT copyright 2022 American Medical Association. All rights reserved. The codes documented in this report are preliminary and upon coder review may  be revised to meet current compliance requirements. Dana Artist, MD 02/07/2023 2:30:55 PM This report has been signed electronically. Number of Addenda: 0

## 2023-02-07 NOTE — Interval H&P Note (Signed)
History and Physical Interval Note:  02/07/2023 2:00 PM  Dana Waters  has presented today for surgery, with the diagnosis of Anemia, heme positive stool.  The various methods of treatment have been discussed with the patient and family. After consideration of risks, benefits and other options for treatment, the patient has consented to  Procedure(s): ESOPHAGOGASTRODUODENOSCOPY (EGD) WITH PROPOFOL (N/A) as a surgical intervention.  The patient's history has been reviewed, patient examined, no change in status, stable for surgery.  I have reviewed the patient's chart and labs.  Questions were answered to the patient's satisfaction.     Pricilla Riffle. Fuller Plan

## 2023-02-07 NOTE — H&P (View-Only) (Signed)
Referring Provider: Dr. Langston Masker, [place] Primary Care Physician:  Robyne Peers, MD Primary Gastroenterologist:  Dr. Henrene Pastor  Reason for Consultation:  GI bleed  HPI: Dana Waters is a 77 y.o. female with a known history of OA and restless legs who presented to the emergency department at [facility] hospital for evaluation of syncope.  Patient was in a usual state of health until Sunday when she reports waking up around 5 AM and vomiting black material.  Walking back from the bathroom she passed out and hit the dresser, which caused her to have 4 broken ribs.  Hgb 8.5 grams down from 13 grams in September.  Repeat later in the day was 7.2 grams.  She was given a unit of PRBCs and another has been ordered.  Heme positive.  Describes also having dark/black stools on Sunday but no further sign of bleeding recently.  Is on PPI gtt here for now.  Uses NSAIDs in the form of Advil at home for her OA, about 2-3 per day for a long time.  Reports a lot of belching/indigestion at home for which she takes OTC H2 blocker such as pepcid quite regularly.  Leading up to this no other GI issues were present.  Does not see red blood in her stool.  Colonoscopy 04/2011 with Dr. Henrene Pastor:  Diminutive polyp and diverticulosis.  Polyp was a tubular adenoma.   History reviewed. No pertinent past medical history.  Past Surgical History:  Procedure Laterality Date   ABDOMINAL HYSTERECTOMY     TONSILLECTOMY      Prior to Admission medications   Medication Sig Start Date End Date Taking? Authorizing Provider  CALCIUM-VITAMIN D PO Take 1 tablet by mouth daily.   Yes [provider]  citalopram (CELEXA) 20 MG tablet Take 20 mg by mouth at bedtime.    Yes [provider]  gabapentin (NEURONTIN) 100 MG capsule Take 100 mg by mouth 3 (three) times daily.   Yes [provider]  ibuprofen (ADVIL,MOTRIN) 200 MG tablet Take 200 mg by mouth every 8 (eight) hours as needed for moderate pain.    Yes [provider]  Multiple Vitamins-Minerals (ALIVE WOMENS 50+) TABS Take 1 tablet by mouth daily as needed (when remembers).   Yes [provider]  zolpidem (AMBIEN) 5 MG tablet Take 5 mg by mouth at bedtime as needed for sleep.   Yes [provider]  amoxicillin-clavulanate (AUGMENTIN) 875-125 MG tablet Take 1 tablet by mouth every 12 (twelve) hours. Patient not taking: Reported on 02/06/2023 09/20/15   Little, Wenda Overland, MD  erythromycin ophthalmic ointment Place 1 Application into the right eye at bedtime. Patient not taking: Reported on 02/06/2023 12/21/22   [provider]  estradiol (ESTRACE) 2 MG tablet Take 2 mg by mouth daily. Patient not taking: Reported on 02/06/2023    [provider]  HYDROcodone-acetaminophen (NORCO/VICODIN) 5-325 MG tablet Take 1 tablet by mouth every 6 (six) hours as needed. Patient not taking: Reported on 02/06/2023 09/20/15   Orma Flaming, MD  oxyCODONE-acetaminophen (PERCOCET) 5-325 MG tablet Take 1 tablet by mouth every 6 (six) hours as needed for severe pain. Patient not taking: Reported on 02/06/2023 09/20/15   Little, Wenda Overland, MD    Current Facility-Administered Medications  Medication Dose Route Frequency Provider Last Rate Last Admin   0.9 %  sodium chloride infusion (Manually program via Guardrails IV Fluids)   Intravenous Once Hugelmeyer, Alexis, DO       0.9 %  sodium chloride infusion   Intravenous Continuous Hugelmeyer, Alexis, DO 100 mL/hr at 02/06/23 2309 New Bag at 02/06/23 2309   acetaminophen (TYLENOL) tablet 650 mg  650 mg Oral Q6H PRN Hugelmeyer, Alexis, DO       Or   acetaminophen (TYLENOL) suppository 650 mg  650 mg Rectal Q6H PRN Hugelmeyer, Alexis, DO       bisacodyl (DULCOLAX) EC tablet 5 mg  5 mg Oral Daily PRN Hugelmeyer, Alexis, DO       citalopram (CELEXA) tablet 20 mg  20 mg Oral QHS Hugelmeyer, Alexis, DO   20 mg at 02/07/23 0022   gabapentin (NEURONTIN) capsule 100 mg  100 mg Oral TID  Hugelmeyer, Alexis, DO       hydrALAZINE (APRESOLINE) injection 5 mg  5 mg Intravenous Q6H PRN Hugelmeyer, Alexis, DO       HYDROcodone-acetaminophen (NORCO/VICODIN) 5-325 MG per tablet 1 tablet  1 tablet Oral Q6H PRN Hugelmeyer, Alexis, DO       morphine (PF) 2 MG/ML injection 1 mg  1 mg Intravenous Q6H PRN Hugelmeyer, Alexis, DO   1 mg at 02/07/23 0029   ondansetron (ZOFRAN) tablet 4 mg  4 mg Oral Q6H PRN Hugelmeyer, Alexis, DO       Or   ondansetron (ZOFRAN) injection 4 mg  4 mg Intravenous Q6H PRN Hugelmeyer, Alexis, DO   4 mg at 02/07/23 0101   oxyCODONE-acetaminophen (PERCOCET/ROXICET) 5-325 MG per tablet 1 tablet  1 tablet Oral Q6H PRN Wyvonnia Dusky, MD   1 tablet at 02/07/23 0333   [START ON 02/10/2023] pantoprazole (PROTONIX) injection 40 mg  40 mg Intravenous Q12H Wyvonnia Dusky, MD       pantoprozole (PROTONIX) 80 mg /NS 100 mL infusion  8 mg/hr Intravenous Continuous Hugelmeyer, Alexis, DO 10 mL/hr at 02/06/23 2123 8 mg/hr at 02/06/23 2123   senna-docusate (Senokot-S) tablet 1 tablet  1 tablet Oral QHS PRN Hugelmeyer, Alexis, DO       sodium chloride flush (NS) 0.9 % injection 3 mL  3 mL Intravenous Q12H Hugelmeyer, Alexis, DO   3 mL at 02/06/23 2310   zolpidem (AMBIEN) tablet 2.5 mg  2.5 mg Oral QHS PRN Hugelmeyer, Alexis, DO   2.5 mg at 02/07/23 0121    Allergies as of 02/06/2023   (No Known Allergies)    Family History  Problem Relation Age of Onset   Hypertension Mother    Stroke Mother    Hypertension Father    Hypertension Sister    Colon cancer Paternal Aunt     Social History   Socioeconomic History   Marital status: Married    Spouse name: Not on file   Number of children: Not on file   Years of education: Not on file   Highest education level: Not on file  Occupational History   Not on file  Tobacco Use   Smoking status: Never   Smokeless tobacco: Never  Substance and Sexual Activity   Alcohol use: Yes    Alcohol/week: 3.0 standard drinks of  alcohol    Types: 3 drink(s) per week   Drug use: No   Sexual activity: Not on file    Comment: Hysterectomy  Other Topics Concern   Not on file  Social History Narrative   Not on file   Social Determinants of Health   Financial Resource Strain: Not on file  Food Insecurity: No Food Insecurity (02/06/2023)   Hunger Vital Sign    Worried About Running Out  of Food in the Last Year: Never true    [place] in the Last Year: Never true  Transportation Needs: No Transportation Needs (02/06/2023)   PRAPARE - Hydrologist (Medical): No    Lack of Transportation (Non-Medical): No  Physical Activity: Not on file  Stress: Not on file  Social Connections: Not on file  Intimate Partner Violence: Not At Risk (02/06/2023)   Humiliation, Afraid, Rape, and Kick questionnaire    Fear of Current or Ex-Partner: No    Emotionally Abused: No    Physically Abused: No    Sexually Abused: No    Review of Systems: ROS is O/W negative except as mentioned in HPI.  Physical Exam: Vital signs in last 24 hours: Temp:  [97.3 F (36.3 C)-99.3 F (37.4 C)] 97.3 F (36.3 C) (02/27 0558) Pulse Rate:  [86-114] 86 (02/27 0558) Resp:  [16-27] 18 (02/27 0558) BP: (120-151)/(53-71) 126/53 (02/27 0558) SpO2:  [90 %-100 %] 93 % (02/27 0558) Weight:  [55.8 kg] 55.8 kg (02/26 1409) Last BM Date : 02/05/23 General:  Alert, Well-developed, well-nourished, pleasant and cooperative in NAD Head:  Normocephalic and atraumatic. Eyes:  Sclera clear, no icterus.  Conjunctiva pink. Ears:  Normal auditory acuity. Mouth:  No deformity or lesions.   Lungs:  Clear throughout to auscultation.  No wheezes, crackles, or rhonchi.  Heart:  Regular rate and rhythm; no murmurs, clicks, rubs, or gallops. Abdomen:  Soft, non-distended.  BS present.  Non-tender. Rectal:  Deferred.  Heme positive in the ED. Msk:  Symmetrical without gross deformities. Pulses:  Normal pulses noted. Extremities:   Without clubbing or edema. Neurologic:  Alert and oriented x 4;  grossly normal neurologically. Skin:  Intact without significant lesions or rashes. Psych:  Alert and cooperative. Normal mood and affect.  Intake/Output from previous day: 02/26 0701 - 02/27 0700 In: 881.7 [P.O.:20; I.V.:541.7; Blood:320] Out: 150 [Urine:150]  Lab Results: Recent Labs    02/06/23 1439 02/06/23 2316  WBC 12.7* 10.4  HGB 8.5* 7.2*  HCT 26.0* 21.3*  PLT 238 179   BMET Recent Labs    02/06/23 1439  NA 133*  K 3.3*  CL 105  CO2 22  GLUCOSE 148*  BUN 36*  CREATININE 1.08*  CALCIUM 8.6*   LFT Recent Labs    02/06/23 1439  PROT 6.4*  ALBUMIN 3.8  AST 22  ALT 16  ALKPHOS 51  BILITOT 0.7   PT/INR Recent Labs    02/06/23 1439  LABPROT 14.0  INR 1.1   Studies/Results: CT Chest W Contrast  Result Date: 02/06/2023 CLINICAL DATA:  Chest trauma.  Fall.  Rib pain. EXAM: CT CHEST, ABDOMEN, AND PELVIS WITH CONTRAST TECHNIQUE: Multidetector CT imaging of the chest, abdomen and pelvis was performed following the standard protocol during bolus administration of intravenous contrast. RADIATION DOSE REDUCTION: This exam was performed according to the departmental dose-optimization program which includes automated exposure control, adjustment of the mA and/or kV according to patient size and/or use of iterative reconstruction technique. CONTRAST:  173m OMNIPAQUE IOHEXOL 300 MG/ML  SOLN COMPARISON:  None Available. FINDINGS: CT CHEST FINDINGS Cardiovascular: No evidence of aortic dissection or transsection. No pericardial fluid. Mediastinum/Nodes: Trachea and esophagus normal. No mediastinal hematoma. Lungs/Pleura: No pneumothorax. No pulmonary contusion. Scattered atelectasis. Musculoskeletal: Minimally displaced fractures of the fifth, sixth, and seventh LEFT ribs laterally (image 62/series 507, for example). No pneumothorax associated with the rib fractures. CT ABDOMEN AND PELVIS FINDINGS Hepatobiliary:  No  focal hepatic lesion. No biliary ductal dilatation. Gallbladder is normal. Common bile duct is normal. Pancreas: Pancreas is normal. No ductal dilatation. No pancreatic inflammation. Spleen: Normal spleen Adrenals/urinary tract: Kidneys enhance symmetrically. Bladder intact Stomach/Bowel: Small hiatal hernia. Duodenum normal. No evidence of bowel injury. No fluid in the mesentery. Vascular/Lymphatic: No aortic injury Reproductive: Post hysterectomy Other: No free fluid. Musculoskeletal: No pelvic fracture IMPRESSION: CHEST IMPRESSION: 1. Minimally displaced fractures of the LEFT fifth, sixth and seventh ribs. 2. No pneumothorax. 3. No pulmonary contusion. 4. No aortic injury. PELVIS IMPRESSION: 1. No evidence of trauma in the abdomen pelvis. 2. No pelvic fracture. Electronically Signed   By: Suzy Bouchard M.D.   On: 02/06/2023 18:34   CT ABDOMEN PELVIS W CONTRAST  Result Date: 02/06/2023 CLINICAL DATA:  Chest trauma.  Fall.  Rib pain. EXAM: CT CHEST, ABDOMEN, AND PELVIS WITH CONTRAST TECHNIQUE: Multidetector CT imaging of the chest, abdomen and pelvis was performed following the standard protocol during bolus administration of intravenous contrast. RADIATION DOSE REDUCTION: This exam was performed according to the departmental dose-optimization program which includes automated exposure control, adjustment of the mA and/or kV according to patient size and/or use of iterative reconstruction technique. CONTRAST:  153m OMNIPAQUE IOHEXOL 300 MG/ML  SOLN COMPARISON:  None Available. FINDINGS: CT CHEST FINDINGS Cardiovascular: No evidence of aortic dissection or transsection. No pericardial fluid. Mediastinum/Nodes: Trachea and esophagus normal. No mediastinal hematoma. Lungs/Pleura: No pneumothorax. No pulmonary contusion. Scattered atelectasis. Musculoskeletal: Minimally displaced fractures of the fifth, sixth, and seventh LEFT ribs laterally (image 62/series 507, for example). No pneumothorax associated with  the rib fractures. CT ABDOMEN AND PELVIS FINDINGS Hepatobiliary: No focal hepatic lesion. No biliary ductal dilatation. Gallbladder is normal. Common bile duct is normal. Pancreas: Pancreas is normal. No ductal dilatation. No pancreatic inflammation. Spleen: Normal spleen Adrenals/urinary tract: Kidneys enhance symmetrically. Bladder intact Stomach/Bowel: Small hiatal hernia. Duodenum normal. No evidence of bowel injury. No fluid in the mesentery. Vascular/Lymphatic: No aortic injury Reproductive: Post hysterectomy Other: No free fluid. Musculoskeletal: No pelvic fracture IMPRESSION: CHEST IMPRESSION: 1. Minimally displaced fractures of the LEFT fifth, sixth and seventh ribs. 2. No pneumothorax. 3. No pulmonary contusion. 4. No aortic injury. PELVIS IMPRESSION: 1. No evidence of trauma in the abdomen pelvis. 2. No pelvic fracture. Electronically Signed   By: SSuzy BouchardM.D.   On: 02/06/2023 18:34    IMPRESSION:  *Blood loss anemia:  Hgb was 13.3 grams in September, down to 8.5 grams yesterday and then 7.2 grams.  Received a unit of PRBCs and may be receiving another.  Is hemoccult positive.  Suspect UGIB in the setting of NSAID use. *Vitamin B12 deficiency:  Low normal at 233. *NSAID use:  Takes Advil for her OA, 2-3 a day for a long time.  PLAN: -Will plan for EGD with Dr. SFuller Planlater today. -Monitor hemoglobin and transfuse further if needed. -Continue IV PPI for now. -Would recommend starting B12 supplement supplementation as outpatient with PCP.  JLaban Emperor Zehr  02/07/2023, 9:06 AM   Attending Physician Note   I have taken a history, reviewed the chart and examined the patient. I performed more than 50% of this encounter in conjunction with the APP. I agree with the APP's note, impression and recommendations with my edits. My additional impressions and recommendations are as follows.   *Dark stool, heme + stool, elevated BUN - suspected UGI bleed in setting of long term ibuprofen use.  R/O ulcer, erosive gastritis.  *ABL anemia *B12 deficiency. Outpatient mgmt  per PCP  *Continue PPI IV bid *Trend Hgb, transfuse for Hgb < 7 *Avoid NSAIDs *EGD today    Lucio Edward, MD [facility] See [facility], [facility], for our on call provider

## 2023-02-07 NOTE — Progress Notes (Signed)
PROGRESS NOTE    Dana Waters  [id number] DOB: 1946/06/16 DOA: 02/06/2023 PCP: Robyne Peers, MD   Brief Narrative: Dana Waters is a 77 y.o. female with a known history of OA, restless legs presents to the emergency department for evaluation of syncope.  Patient was in a usual state of health until 3 days ago when she reports feeling generalized weakness and fatigue associate with nausea and black vomiting times several episodes.  1 day prior to arrival in the emergency department she had some dizziness and lightheadedness which caused her to fall into a dresser landing on her left side.  She was seen in urgent care and was diagnosed with 4 broken ribs..   Also of note she reports black stools.  She does take NSAIDs regularly (2-4 per day) for OA knee pain.  Assessment & Plan:   Principal Problem:   Gastrointestinal hemorrhage Active Problems:   Near syncope   Multiple closed fractures of ribs of left side   #1 upper GI bleeding likely secondary to NSAID use.  Planning for EGD today.  Continue Protonix.  IV. She received 1 unit of blood transfusion.  Her hemoglobin was 13 -5 months ago came down to 7.2 today. She is a FOBT  positive. Trend H&H.  #2 syncope likely secondary to upper GI bleed Continue to monitor in telemetry TSH 2.4 Trop 6  #3 left rib fracture status post fall symptomatic treatment Percocet as needed lidocaine patch ordered Incentive spirometer   #4 acute blood loss anemia secondary to upper GI bleed. Iron studies-iron 40, TIBC 286, ferritin 38 B12 and folate-233 and 16.3  #5 hypokalemia- replete Check mag   Estimated body mass index is 22.5 kg/m as calculated from the following:   Height as of this encounter: '5\' 2"'$  (1.575 m).   Weight as of this encounter: 55.8 kg.  DVT prophylaxis: None due to GI bleed Code Status: Full code Family Communication: Daughter and husband at bedside Disposition Plan:  Status is: Inpatient Remains inpatient  appropriate because: GI bleed   Consultants:  GI  Procedures: EGD planned for 02/07/2023 Antimicrobials: None  Subjective:  Complains of left rib pain did not sleep well Objective: Vitals:   02/07/23 0558 02/07/23 1005 02/07/23 1012 02/07/23 1039  BP: (!) 126/53 138/68 138/68 120/73  Pulse: 86 88 88 92  Resp: '18 18 18 18  '$ Temp: (!) 97.3 F (36.3 C) 98 F (36.7 C) 98.7 F (37.1 C) 98.2 F (36.8 C)  TempSrc: Oral Oral Oral Oral  SpO2: 93% (!) 89% (!) 88% 92%  Weight:      Height:        Intake/Output Summary (Last 24 hours) at 02/07/2023 1215 Last data filed at 02/07/2023 1000 Gross per 24 hour  Intake 881.73 ml  Output 150 ml  Net 731.73 ml   Filed Weights   02/06/23 1409  Weight: 55.8 kg    Examination:  General exam: Appears weak Respiratory system: Clear to auscultation. Respiratory effort normal. Cardiovascular system: S1 & S2 heard, RRR. No JVD, murmurs, rubs, gallops or clicks. No pedal edema. Gastrointestinal system: Abdomen is nondistended, soft and nontender. No organomegaly or masses felt. Normal bowel sounds heard. Central nervous system: Alert and oriented. No focal neurological deficits. Extremities: no edema. Skin: No rashes, lesions or ulcers Psychiatry: Judgement and insight appear normal. Mood & affect appropriate.     Data Reviewed: I have personally reviewed following labs and imaging studies  CBC: Recent Labs  Lab 02/06/23 1439 02/06/23  2316  WBC 12.7* 10.4  NEUTROABS 10.5*  --   HGB 8.5* 7.2*  HCT 26.0* 21.3*  MCV 99.2 99.1  PLT 238 0000000   Basic Metabolic Panel: Recent Labs  Lab 02/06/23 1439  NA 133*  K 3.3*  CL 105  CO2 22  GLUCOSE 148*  BUN 36*  CREATININE 1.08*  CALCIUM 8.6*   GFR: Estimated Creatinine Clearance: 35 mL/min (A) (by C-G formula based on SCr of 1.08 mg/dL (H)). Liver Function Tests: Recent Labs  Lab 02/06/23 1439  AST 22  ALT 16  ALKPHOS 51  BILITOT 0.7  PROT 6.4*  ALBUMIN 3.8   No results  for input(s): "LIPASE", "AMYLASE" in the last 168 hours. No results for input(s): "AMMONIA" in the last 168 hours. Coagulation Profile: Recent Labs  Lab 02/06/23 1439  INR 1.1   Cardiac Enzymes: No results for input(s): "CKTOTAL", "CKMB", "CKMBINDEX", "TROPONINI" in the last 168 hours. BNP (last 3 results) No results for input(s): "PROBNP" in the last 8760 hours. HbA1C: No results for input(s): "HGBA1C" in the last 72 hours. CBG: No results for input(s): "GLUCAP" in the last 168 hours. Lipid Profile: No results for input(s): "CHOL", "HDL", "LDLCALC", "TRIG", "CHOLHDL", "LDLDIRECT" in the last 72 hours. Thyroid Function Tests: Recent Labs    02/06/23 2316  TSH 2.415   Anemia Panel: Recent Labs    02/06/23 2316  VITAMINB12 233  FOLATE 16.3  FERRITIN 38  TIBC 286  IRON 40   Sepsis Labs: No results for input(s): "PROCALCITON", "LATICACIDVEN" in the last 168 hours.  No results found for this or any previous visit (from the past 240 hour(s)).       Radiology Studies: CT Chest W Contrast  Result Date: 02/06/2023 CLINICAL DATA:  Chest trauma.  Fall.  Rib pain. EXAM: CT CHEST, ABDOMEN, AND PELVIS WITH CONTRAST TECHNIQUE: Multidetector CT imaging of the chest, abdomen and pelvis was performed following the standard protocol during bolus administration of intravenous contrast. RADIATION DOSE REDUCTION: This exam was performed according to the departmental dose-optimization program which includes automated exposure control, adjustment of the mA and/or kV according to patient size and/or use of iterative reconstruction technique. CONTRAST:  119m OMNIPAQUE IOHEXOL 300 MG/ML  SOLN COMPARISON:  None Available. FINDINGS: CT CHEST FINDINGS Cardiovascular: No evidence of aortic dissection or transsection. No pericardial fluid. Mediastinum/Nodes: Trachea and esophagus normal. No mediastinal hematoma. Lungs/Pleura: No pneumothorax. No pulmonary contusion. Scattered atelectasis.  Musculoskeletal: Minimally displaced fractures of the fifth, sixth, and seventh LEFT ribs laterally (image 62/series 507, for example). No pneumothorax associated with the rib fractures. CT ABDOMEN AND PELVIS FINDINGS Hepatobiliary: No focal hepatic lesion. No biliary ductal dilatation. Gallbladder is normal. Common bile duct is normal. Pancreas: Pancreas is normal. No ductal dilatation. No pancreatic inflammation. Spleen: Normal spleen Adrenals/urinary tract: Kidneys enhance symmetrically. Bladder intact Stomach/Bowel: Small hiatal hernia. Duodenum normal. No evidence of bowel injury. No fluid in the mesentery. Vascular/Lymphatic: No aortic injury Reproductive: Post hysterectomy Other: No free fluid. Musculoskeletal: No pelvic fracture IMPRESSION: CHEST IMPRESSION: 1. Minimally displaced fractures of the LEFT fifth, sixth and seventh ribs. 2. No pneumothorax. 3. No pulmonary contusion. 4. No aortic injury. PELVIS IMPRESSION: 1. No evidence of trauma in the abdomen pelvis. 2. No pelvic fracture. Electronically Signed   By: SSuzy BouchardM.D.   On: 02/06/2023 18:34   CT ABDOMEN PELVIS W CONTRAST  Result Date: 02/06/2023 CLINICAL DATA:  Chest trauma.  Fall.  Rib pain. EXAM: CT CHEST, ABDOMEN, AND PELVIS WITH CONTRAST TECHNIQUE:  Multidetector CT imaging of the chest, abdomen and pelvis was performed following the standard protocol during bolus administration of intravenous contrast. RADIATION DOSE REDUCTION: This exam was performed according to the departmental dose-optimization program which includes automated exposure control, adjustment of the mA and/or kV according to patient size and/or use of iterative reconstruction technique. CONTRAST:  166m OMNIPAQUE IOHEXOL 300 MG/ML  SOLN COMPARISON:  None Available. FINDINGS: CT CHEST FINDINGS Cardiovascular: No evidence of aortic dissection or transsection. No pericardial fluid. Mediastinum/Nodes: Trachea and esophagus normal. No mediastinal hematoma. Lungs/Pleura:  No pneumothorax. No pulmonary contusion. Scattered atelectasis. Musculoskeletal: Minimally displaced fractures of the fifth, sixth, and seventh LEFT ribs laterally (image 62/series 507, for example). No pneumothorax associated with the rib fractures. CT ABDOMEN AND PELVIS FINDINGS Hepatobiliary: No focal hepatic lesion. No biliary ductal dilatation. Gallbladder is normal. Common bile duct is normal. Pancreas: Pancreas is normal. No ductal dilatation. No pancreatic inflammation. Spleen: Normal spleen Adrenals/urinary tract: Kidneys enhance symmetrically. Bladder intact Stomach/Bowel: Small hiatal hernia. Duodenum normal. No evidence of bowel injury. No fluid in the mesentery. Vascular/Lymphatic: No aortic injury Reproductive: Post hysterectomy Other: No free fluid. Musculoskeletal: No pelvic fracture IMPRESSION: CHEST IMPRESSION: 1. Minimally displaced fractures of the LEFT fifth, sixth and seventh ribs. 2. No pneumothorax. 3. No pulmonary contusion. 4. No aortic injury. PELVIS IMPRESSION: 1. No evidence of trauma in the abdomen pelvis. 2. No pelvic fracture. Electronically Signed   By: SSuzy BouchardM.D.   On: 02/06/2023 18:34        Scheduled Meds:  sodium chloride   Intravenous Once   citalopram  20 mg Oral QHS   gabapentin  100 mg Oral TID   lidocaine  1 patch Transdermal Q24H   [START ON 02/10/2023] pantoprazole  40 mg Intravenous Q12H   sodium chloride flush  3 mL Intravenous Q12H   Continuous Infusions:  sodium chloride 100 mL/hr at 02/06/23 2309   pantoprazole 8 mg/hr (02/06/23 2123)     LOS: 1 day    Time spent: 35 minutes   EGeorgette Shell MD  02/07/2023, 12:15 PM

## 2023-02-07 NOTE — TOC CM/SW Note (Signed)
  Transition of Care [facility]) Screening Note   Patient Details  Name: Gitanjali Mormino Date of Birth: 11-03-1946   Transition of Care [facility]) CM/SW Contact:    Lennart Pall, LCSW Phone Number: 02/07/2023, 3:10 PM    Transition of Care Department [facility]) has reviewed patient and no TOC needs have been identified at this time. We will continue to monitor patient advancement through interdisciplinary progression rounds. If new patient transition needs arise, please place a TOC consult.

## 2023-02-07 NOTE — Transfer of Care (Addendum)
Immediate Anesthesia Transfer of Care Note  Patient: Dana Waters  Procedure(s) Performed: ESOPHAGOGASTRODUODENOSCOPY (EGD) WITH PROPOFOL BIOPSY  Patient Location: PACU and Endoscopy Unit  Anesthesia Type:MAC  Level of Consciousness: awake, alert , and patient cooperative  Airway & Oxygen Therapy: Patient Spontanous Breathing and Patient connected to face mask oxygen  Post-op Assessment: Report given to RN and Post -op Vital signs reviewed and stable  Post vital signs: Reviewed and stable  Last Vitals:  Vitals Value Taken Time  BP 143/62   Temp    Pulse 72 02/07/23 1428  Resp 18 02/07/23 1428  SpO2 100 % 02/07/23 1428  Vitals shown include unvalidated device data.  Last Pain:  Vitals:   02/07/23 1254  TempSrc: Temporal  PainSc: 5       Patients Stated Pain Goal: 2 ([id number])  Complications: No notable events documented.

## 2023-02-08 ENCOUNTER — Encounter: Payer: Self-pay | Admitting: Gastroenterology

## 2023-02-08 ENCOUNTER — Encounter (HOSPITAL_COMMUNITY): Payer: Self-pay | Admitting: Gastroenterology

## 2023-02-08 DIAGNOSIS — K222 Esophageal obstruction: Secondary | ICD-10-CM

## 2023-02-08 DIAGNOSIS — K3189 Other diseases of stomach and duodenum: Secondary | ICD-10-CM | POA: Diagnosis present

## 2023-02-08 DIAGNOSIS — K259 Gastric ulcer, unspecified as acute or chronic, without hemorrhage or perforation: Secondary | ICD-10-CM | POA: Diagnosis present

## 2023-02-08 DIAGNOSIS — K209 Esophagitis, unspecified without bleeding: Secondary | ICD-10-CM | POA: Diagnosis present

## 2023-02-08 DIAGNOSIS — K922 Gastrointestinal hemorrhage, unspecified: Secondary | ICD-10-CM | POA: Diagnosis not present

## 2023-02-08 DIAGNOSIS — D62 Acute posthemorrhagic anemia: Secondary | ICD-10-CM

## 2023-02-08 DIAGNOSIS — R55 Syncope and collapse: Secondary | ICD-10-CM | POA: Diagnosis not present

## 2023-02-08 DIAGNOSIS — S2242XA Multiple fractures of ribs, left side, initial encounter for closed fracture: Secondary | ICD-10-CM | POA: Diagnosis not present

## 2023-02-08 LAB — TYPE AND SCREEN
ABO/RH(D): A POS
Antibody Screen: NEGATIVE
Unit division: 0
Unit division: 0

## 2023-02-08 LAB — BASIC METABOLIC PANEL
Anion gap: 4 — ABNORMAL LOW (ref 5–15)
BUN: 12 mg/dL (ref 8–23)
CO2: 22 mmol/L (ref 22–32)
Calcium: 8.2 mg/dL — ABNORMAL LOW (ref 8.9–10.3)
Chloride: 114 mmol/L — ABNORMAL HIGH (ref 98–111)
Creatinine, Ser: 0.75 mg/dL (ref 0.44–1.00)
GFR, Estimated: >60 mL/min (ref >60)
Glucose, Bld: 113 mg/dL — ABNORMAL HIGH (ref 70–99)
Potassium: 4.7 mmol/L (ref 3.5–5.1)
Sodium: 140 mmol/L (ref 135–145)

## 2023-02-08 LAB — CBC
HCT: 29.8 % — ABNORMAL LOW (ref 36.0–46.0)
Hemoglobin: 9.7 g/dL — ABNORMAL LOW (ref 12.0–15.0)
MCH: 31.6 pg (ref 26.0–34.0)
MCHC: 32.6 g/dL (ref 30.0–36.0)
MCV: 97.1 fL (ref 80.0–100.0)
Platelets: 190 K/uL (ref 150–400)
RBC: 3.07 MIL/uL — ABNORMAL LOW (ref 3.87–5.11)
RDW: 15.5 % (ref 11.5–15.5)
WBC: 8.2 K/uL (ref 4.0–10.5)
nRBC: 0 % (ref 0.0–0.2)

## 2023-02-08 LAB — BPAM RBC
Blood Product Expiration Date: 202403172359
Blood Product Expiration Date: 202403182359
ISSUE DATE / TIME: 202402270317
ISSUE DATE / TIME: 202402270957
Unit Type and Rh: 6200
Unit Type and Rh: 6200

## 2023-02-08 LAB — SURGICAL PATHOLOGY

## 2023-02-08 MED ORDER — OXYCODONE HCL 5 MG PO TABS: 5.0000 mg | ORAL_TABLET | Freq: Four times a day (QID) | ORAL | Status: DC | PRN

## 2023-02-08 MED ORDER — LIDOCAINE 5 % EX PTCH: 1.0000 | MEDICATED_PATCH | CUTANEOUS | 0 refills | Status: DC

## 2023-02-08 MED ORDER — OXYCODONE HCL 5 MG PO TABS
5.0000 mg | ORAL_TABLET | Freq: Once | ORAL | Status: AC
  Administered 2023-02-08: 5 mg via ORAL
  Filled 2023-02-08: qty 1

## 2023-02-08 MED ORDER — ACETAMINOPHEN 500 MG PO TABS: 500.0000 mg | ORAL_TABLET | Freq: Three times a day (TID) | ORAL | Status: DC

## 2023-02-08 MED ORDER — ACETAMINOPHEN 325 MG PO TABS: 650.0000 mg | ORAL_TABLET | Freq: Four times a day (QID) | ORAL | Status: AC | PRN

## 2023-02-08 MED ORDER — SENNOSIDES-DOCUSATE SODIUM 8.6-50 MG PO TABS: 1.0000 | ORAL_TABLET | Freq: Two times a day (BID) | ORAL | Status: AC

## 2023-02-08 MED ORDER — OXYCODONE HCL 5 MG PO TABS: 5.0000 mg | ORAL_TABLET | Freq: Four times a day (QID) | ORAL | 0 refills | Status: DC | PRN

## 2023-02-08 MED ORDER — ACETAMINOPHEN 500 MG PO TABS: 500.0000 mg | ORAL_TABLET | Freq: Three times a day (TID) | ORAL | 0 refills | Status: AC

## 2023-02-08 MED ORDER — OXYCODONE HCL 5 MG PO TABS: 5.0000 mg | ORAL_TABLET | ORAL | Status: DC | PRN

## 2023-02-08 MED ORDER — PANTOPRAZOLE SODIUM 40 MG PO TBEC: 40.0000 mg | DELAYED_RELEASE_TABLET | Freq: Every day | ORAL | 1 refills | Status: DC

## 2023-02-08 NOTE — Anesthesia Postprocedure Evaluation (Signed)
Anesthesia Post Note  Patient: Dana Waters  Procedure(s) Performed: ESOPHAGOGASTRODUODENOSCOPY (EGD) WITH PROPOFOL BIOPSY     Patient location during evaluation: Endoscopy Anesthesia Type: MAC Level of consciousness: awake Pain management: pain level controlled Vital Signs Assessment: post-procedure vital signs reviewed and stable Respiratory status: spontaneous breathing, nonlabored ventilation and respiratory function stable Cardiovascular status: blood pressure returned to baseline and stable Postop Assessment: no apparent nausea or vomiting Anesthetic complications: no   No notable events documented.  Last Vitals:  Vitals:   02/07/23 2150 02/08/23 0420  BP: (!) 121/54 138/64  Pulse: 68 69  Resp: 18 18  Temp: 37.1 C 36.7 C  SpO2: 94% 92%    Last Pain:  Vitals:   02/08/23 0420  TempSrc: Oral  PainSc:                  Karyl Kinnier Staci Carver

## 2023-02-08 NOTE — Progress Notes (Signed)
OT Cancellation Note  Patient Details Name: Dana Waters MRN: [id number] DOB: 1946-04-21   Cancelled Treatment:    Reason Eval/Treat Not Completed: OT screened, no needs identified, will sign off Patient and husband in room endorsed patient being able to complete ADL tasks at baseline at this time. OT signing off. Thank you for this referral.  Rennie Plowman, MS Acute Rehabilitation Department Office# [phone]  02/08/2023, 2:39 PM

## 2023-02-08 NOTE — Evaluation (Signed)
Physical Therapy Evaluation Patient Details Name: Dana Waters MRN: [id number] DOB: 1946-12-06 Today's Date: 02/08/2023  History of Present Illness  Dana Waters is a 77 y.o. female with a known history of OA, restless legs presents to the emergency department for evaluation of syncope.  Patient was in a usual state of health until 3 days ago when she reports feeling generalized weakness and fatigue associate with nausea and black vomiting times several episodes.  1 day prior to arrival in the emergency department she had some dizziness and lightheadedness which caused her to fall into a dresser landing on her left side.  She was seen in urgent care and was diagnosed with 4 broken ribs..  Clinical Impression  The patient tolerated ambulation, wi;ll benefit from RW for safety. Patient to Dc home, no further PT.       Recommendations for follow up therapy are one component of a multi-disciplinary discharge planning process, led by the attending physician.  Recommendations may be updated based on patient status, additional functional criteria and insurance authorization.  Follow Up Recommendations No PT follow up      Assistance Recommended at Discharge Set up Supervision/Assistance  Patient can return home with the following  A little help with walking and/or transfers;Assistance with cooking/housework;Assist for transportation;Help with stairs or ramp for entrance;A little help with bathing/dressing/bathroom    Equipment Recommendations Rolling walker (2 wheels)  Recommendations for Other Services       Functional Status Assessment Patient has had a recent decline in their functional status and demonstrates the ability to make significant improvements in function in a reasonable and predictable amount of time.     Precautions / Restrictions Precautions Precautions: Fall Precaution Comments: left fx ribs pain      Mobility  Bed Mobility Overal bed mobility: Needs Assistance Bed  Mobility: Supine to Sit     Supine to sit: Min guard     General bed mobility comments: guarding left side,    Transfers Overall transfer level: Needs assistance Equipment used: Rolling walker (2 wheels) Transfers: Sit to/from Stand Sit to Stand: Min guard                Ambulation/Gait Ambulation/Gait assistance: Min guard Gait Distance (Feet): 50 Feet Assistive device: Rolling walker (2 wheels) Gait Pattern/deviations: Step-through pattern       General Gait Details: steady giat, slow  Stairs            Wheelchair Mobility    Modified Rankin (Stroke Patients Only)       Balance Overall balance assessment: Mild deficits observed, not formally tested                                           Pertinent Vitals/Pain Pain Assessment Pain Assessment: 0-10 Pain Score: 6  Pain Location: left side Pain Descriptors / Indicators: Cramping, Grimacing Pain Intervention(s): Monitored during session, Premedicated before session, Repositioned    Home Living Family/patient expects to be discharged to:: Private residence Living Arrangements: Spouse/significant other;Children Available Help at Discharge: Family;Available 24 hours/day Type of Home: House Home Access: Level entry       Home Layout: Two level;Able to live on main level with bedroom/bathroom Home Equipment: Shower seat - built in      Prior Function Prior Level of Function : Independent/Modified Independent  Hand Dominance        Extremity/Trunk Assessment   Upper Extremity Assessment Upper Extremity Assessment: Overall WFL for tasks assessed    Lower Extremity Assessment Lower Extremity Assessment: Overall WFL for tasks assessed    Cervical / Trunk Assessment Cervical / Trunk Assessment: Kyphotic  Communication   Communication: No difficulties  Cognition Arousal/Alertness: Awake/alert Behavior During Therapy: WFL for tasks  assessed/performed Overall Cognitive Status: Within Functional Limits for tasks assessed                                          General Comments      Exercises     Assessment/Plan    PT Assessment Patient does not need any further PT services  PT Problem List Pain;Decreased mobility;Decreased activity tolerance       PT Treatment Interventions DME instruction;Functional mobility training;Gait training;Patient/family education    PT Goals (Current goals can be found in the Care Plan section)  Acute Rehab PT Goals PT Goal Formulation: All assessment and education complete, DC therapy    Frequency       Co-evaluation               AM-PAC PT "6 Clicks" Mobility  Outcome Measure Help needed turning from your back to your side while in a flat bed without using bedrails?: A Little Help needed moving from lying on your back to sitting on the side of a flat bed without using bedrails?: A Little Help needed moving to and from a bed to a chair (including a wheelchair)?: A Little Help needed standing up from a chair using your arms (e.g., wheelchair or bedside chair)?: A Little Help needed to walk in hospital room?: A Little Help needed climbing 3-5 steps with a railing? : A Little 6 Click Score: 18    End of Session   Activity Tolerance: Patient tolerated treatment well Patient left: in chair;with call bell/phone within reach;with family/visitor present Nurse Communication: Mobility status PT Visit Diagnosis: Difficulty in walking, not elsewhere classified (R26.2);Pain Pain - Right/Left: Left    Time: 1200-1230 PT Time Calculation (min) (ACUTE ONLY): 30 min   Charges:   PT Evaluation $PT Eval Low Complexity: 1 Low PT Treatments $Gait Training: 8-22 mins        [place] Office [phone] Weekend [id number]   Claretha Cooper 02/08/2023, 4:35 PM

## 2023-02-08 NOTE — Progress Notes (Signed)
Gastric path result reviewed with patient. Mild reactive change. No H pylori identified.  See EGD report for recommendations.

## 2023-02-08 NOTE — TOC Transition Note (Signed)
Transition of Care [facility]) - CM/SW Discharge Note   Patient Details  Name: Dana Waters MRN: [id number] Date of Birth: [birth year]  Transition of Care [facility]) CM/SW Contact:  Lennart Pall, LCSW Phone Number: 02/08/2023, 12:58 PM   Clinical Narrative:     Alerted by PT that pt needing youth RW for home - no DME agency preference.  Order placed with [facility] who will delivery to room today.  No further TOC needs.  Final next level of care: Home/Self Care     Patient Goals and CMS Choice      Discharge Placement                         Discharge Plan and Services Additional resources added to the After Visit Summary for                  DME Arranged: Walker youth DME Agency: [employer] Date DME Agency Contacted: 02/08/23 Time DME Agency Contacted: [id number] Representative spoke with at DME Agency: [place] ([place] Interventions [place]: No Food Insecurity (02/06/2023)  Housing: Low Risk  (02/06/2023)  Transportation Needs: No Transportation Needs (02/06/2023)  Utilities: Not At Risk (02/06/2023)  Tobacco Use: Low Risk  (02/07/2023)     Readmission Risk Interventions     No data to display

## 2023-02-08 NOTE — Discharge Summary (Signed)
Physician Discharge Summary  Dana Waters [id number] DOB: August 23, 1946 DOA: 02/06/2023  PCP: Robyne Peers, MD  Admit date: 02/06/2023 Discharge date: 02/08/2023  Time spent: 60 minutes  Recommendations for Outpatient Follow-up:  Follow-up with Robyne Peers, MD in 1 to 2 weeks.  On follow-up patient will need a CBC done to follow-up on hemoglobin.  Patient also need a basic metabolic profile done to follow-up on electrolytes and renal function.  Patient's rib fractures will need to be reassessed for continued pain management. Follow-up with Dr. Henrene Pastor, gastroenterology in 1 month.  On follow-up biopsies done during hospitalization with EGD will need to be followed up upon.  Patient per GI will likely need a surveillance colonoscopy and repeat EGD in 2 to 3 months to follow-up on ulcer healing.   Discharge Diagnoses:  Principal Problem:   UGIB (upper gastrointestinal bleed) Active Problems:   Near syncope   Multiple closed fractures of ribs of left side   Occult blood in stools   Acute gastric ulcer with hemorrhage   Gastric ulcer   Esophagitis   Esophageal stenosis   Erosive gastropathy   Acute blood loss anemia   Discharge Condition: Stable and improved.  Diet recommendation: Heart healthy  Filed Weights   02/06/23 1409 02/07/23 1254  Weight: 55.8 kg 55.7 kg    History of present illness:  HPI per Dr. Ara Kussmaul 02/06/2023  Dana Waters is a 77 y.o. female with a known history of OA, restless legs presents to the emergency department for evaluation of syncope.  Patient was in a usual state of health until 3 days ago when she reports feeling generalized weakness and fatigue associate with nausea and black vomiting times several episodes.  1 day prior to arrival in the emergency department she had some dizziness and lightheadedness which caused her to fall into a dresser landing on her left side.  She was seen in urgent care and was diagnosed with 4 broken ribs..    Also of note she reports black stools.  She does take NSAIDs regularly (2-4 per day) for OA knee pain.    Otherwise there has been no change in status. Patient has been taking medication as prescribed and there has been no recent change in medication or diet.  No recent antibiotics.  There has been no recent illness, hospitalizations, travel or sick contacts.     EMS/ED Course: Patient received Protonix, Percocet. Medical admission has been requested for further management of upper GI bleed, near syncope.  Hospital Course:  #1 upper GI bleed/gastric ulcers per EGD 02/07/2023/LA grade B esophagitis/esophageal stenosis benign/erosive gastropathy -Patient presented with generalized weakness, fatigue, black-colored emesis times several episodes with some associated dizziness and lightheadedness leading to a fall into a dresser landing on her left side. -Patient seen in urgent care diagnosed with 4 broken ribs.  Patient also reported some melanotic stools.  Patient noted to be on NSAIDs regularly 2-4 times daily for OA knee pain. -Patient seen in the ED, FOBT done was positive. -Hemoglobin noted as low as 7.2 and patient transfused 2 units packed red blood cells with hemoglobin stabilized at 9.7. -Patient placed on IV PPI, GI consulted. -Patient seen in consultation by Dr. Fuller Plan underwent upper endoscopy 02/07/2023 which showed LA grade B reflux esophagitis with no bleeding, benign-appearing esophageal stenosis, small hiatal hernia, 2 nonbleeding gastric ulcers with clean ulcer bases which were biopsied, erosive gastropathy with no bleeding and no stigmata of recent bleeding.  Normal duodenal bulb and second portion of  the duodenum. -Patient placed back on a diet, monitored and hemoglobin remained stable at 9.7 by day of discharge. -Patient told and noted to avoid all NSAIDs. -Patient was discharged home in stable improved condition will need follow-up with PCP and GI in the outpatient setting. -Per GI  patient will likely need a surveillance colonoscopy and repeat EGD in 2 to 3 months to follow-up on ulcer healing and patient will need to follow-up with primary gastroenterologist in 1 month. -Patient will be discharged on PPI daily with outpatient follow-up.  2.  Left-sided rib fractures ribs 5 6 and 7 -To have had a fall prior to admission due to lightheadedness and dizziness following and drawers on the left side. -CT chest abdomen and pelvis done concerning for minimally displaced left fifth sixth and seventh rib fractures. -Patient placed on incentive spirometry, pain management. -Patient will be discharged on scheduled Tylenol 500 mg 3 times daily x 7 days, oxycodone as needed, lidocaine patch, incentive spirometry. -Outpatient follow-up with PCP.  3.?  Syncope -Felt likely secondary to upper GI bleed. -TSH done within normal limits.  No abnormalities noted on telemetry. -Troponin noted at 6. -Patient transfused 2 units PRBCs as noted in problem 1, no further syncopal episodes. -Outpatient follow-up with PCP.  4.  Acute blood loss anemia secondary to upper GI bleed -See problem #1.  5.  Hypokalemia -Magnesium noted at 2.1. -Repleted.   Procedures: Upper endoscopy 02/07/2023 per Dr. Fuller Plan Transfusion 2 units packed red blood cells 02/07/2023 CT chest 02/06/2023 CT abdomen and pelvis 02/06/2023  Consultations: Gastroenterology: Dr. Fuller Plan 02/07/2023  Discharge Exam: Vitals:   02/08/23 0420 02/08/23 1355  BP: 138/64 (!) 162/62  Pulse: 69 62  Resp: 18 18  Temp: 98 F (36.7 C) 97.6 F (36.4 C)  SpO2: 92% 94%    General: NAD Cardiovascular: RRR no murmurs rubs or gallops.  No JVD.  No lower extremity edema. Respiratory: Lungs clear to auscultation bilaterally.  No wheezes, no crackles, no rhonchi.  Fair air movement.  Speaking in full sentences.  Discharge Instructions   Discharge Instructions     Diet - low sodium heart healthy   Complete by: As directed     Discharge instructions   Complete by: As directed    No Nsaids use: No Advil, no ibuprofen, no Goody's powder, no Naprosyn.   Increase activity slowly   Complete by: As directed       Allergies as of 02/08/2023   No Known Allergies      Medication List     STOP taking these medications    amoxicillin-clavulanate 875-125 MG tablet Commonly known as: AUGMENTIN   estradiol 2 MG tablet Commonly known as: ESTRACE   HYDROcodone-acetaminophen 5-325 MG tablet Commonly known as: NORCO/VICODIN   ibuprofen 200 MG tablet Commonly known as: ADVIL   oxyCODONE-acetaminophen 5-325 MG tablet Commonly known as: Percocet       TAKE these medications    acetaminophen 500 MG tablet Commonly known as: TYLENOL Take 1 tablet (500 mg total) by mouth 3 (three) times daily for 7 days.   acetaminophen 325 MG tablet Commonly known as: TYLENOL Take 2 tablets (650 mg total) by mouth every 6 (six) hours as needed for mild pain (or Fever >/= 101). Start taking on: February 15, 2023   Alive Womens 50+ Tabs Take 1 tablet by mouth daily as needed (when remembers).   CALCIUM-VITAMIN D PO Take 1 tablet by mouth daily.   citalopram 20 MG tablet Commonly known as:  CELEXA Take 20 mg by mouth at bedtime.   erythromycin ophthalmic ointment Place 1 Application into the right eye at bedtime.   gabapentin 100 MG capsule Commonly known as: NEURONTIN Take 100 mg by mouth 3 (three) times daily.   lidocaine 5 % Commonly known as: LIDODERM Place 1 patch onto the skin daily. Remove & Discard patch within 12 hours or as directed by MD Start taking on: February 09, 2023   oxyCODONE 5 MG immediate release tablet Commonly known as: Oxy IR/ROXICODONE Take 1 tablet (5 mg total) by mouth every 6 (six) hours as needed for moderate pain.   pantoprazole 40 MG tablet Commonly known as: Protonix Take 1 tablet (40 mg total) by mouth daily.   senna-docusate 8.6-50 MG tablet Commonly known as: Senokot-S Take  1 tablet by mouth 2 (two) times daily for 7 days.   zolpidem 5 MG tablet Commonly known as: AMBIEN Take 5 mg by mouth at bedtime as needed for sleep.               Durable Medical Equipment  (From admission, onward)           Start     Ordered   02/08/23 1214  For home use only DME Walker rolling  Once       Comments: youth  Question Answer Comment  Walker: With 5 Inch Wheels   Patient needs a walker to treat with the following condition Difficulty in walking, not elsewhere classified      02/08/23 1213           No Known Allergies  Follow-up Information     Robyne Peers, MD. Schedule an appointment as soon as possible for a visit in 2 week(s).   Specialty: Family Medicine Why: Follow-up in 1 to 2 weeks. Contact information: [id number] [place] [address] [phone]         Irene Shipper, MD. Schedule an appointment as soon as possible for a visit in 1 month(s).   Specialty: Gastroenterology Contact information: [address] [place] [address] [phone]                  The results of significant diagnostics from this hospitalization (including imaging, microbiology, ancillary and laboratory) are listed below for reference.    Significant Diagnostic Studies: CT Chest W Contrast  Result Date: 02/06/2023 CLINICAL DATA:  Chest trauma.  Fall.  Rib pain. EXAM: CT CHEST, ABDOMEN, AND PELVIS WITH CONTRAST TECHNIQUE: Multidetector CT imaging of the chest, abdomen and pelvis was performed following the standard protocol during bolus administration of intravenous contrast. RADIATION DOSE REDUCTION: This exam was performed according to the departmental dose-optimization program which includes automated exposure control, adjustment of the mA and/or kV according to patient size and/or use of iterative reconstruction technique. CONTRAST:  186m OMNIPAQUE IOHEXOL 300 MG/ML  SOLN COMPARISON:  None Available. FINDINGS: CT CHEST  FINDINGS Cardiovascular: No evidence of aortic dissection or transsection. No pericardial fluid. Mediastinum/Nodes: Trachea and esophagus normal. No mediastinal hematoma. Lungs/Pleura: No pneumothorax. No pulmonary contusion. Scattered atelectasis. Musculoskeletal: Minimally displaced fractures of the fifth, sixth, and seventh LEFT ribs laterally (image 62/series 507, for example). No pneumothorax associated with the rib fractures. CT ABDOMEN AND PELVIS FINDINGS Hepatobiliary: No focal hepatic lesion. No biliary ductal dilatation. Gallbladder is normal. Common bile duct is normal. Pancreas: Pancreas is normal. No ductal dilatation. No pancreatic inflammation. Spleen: Normal spleen Adrenals/urinary tract: Kidneys enhance symmetrically. Bladder intact Stomach/Bowel: Small  hiatal hernia. Duodenum normal. No evidence of bowel injury. No fluid in the mesentery. Vascular/Lymphatic: No aortic injury Reproductive: Post hysterectomy Other: No free fluid. Musculoskeletal: No pelvic fracture IMPRESSION: CHEST IMPRESSION: 1. Minimally displaced fractures of the LEFT fifth, sixth and seventh ribs. 2. No pneumothorax. 3. No pulmonary contusion. 4. No aortic injury. PELVIS IMPRESSION: 1. No evidence of trauma in the abdomen pelvis. 2. No pelvic fracture. Electronically Signed   By: Suzy Bouchard M.D.   On: 02/06/2023 18:34   CT ABDOMEN PELVIS W CONTRAST  Result Date: 02/06/2023 CLINICAL DATA:  Chest trauma.  Fall.  Rib pain. EXAM: CT CHEST, ABDOMEN, AND PELVIS WITH CONTRAST TECHNIQUE: Multidetector CT imaging of the chest, abdomen and pelvis was performed following the standard protocol during bolus administration of intravenous contrast. RADIATION DOSE REDUCTION: This exam was performed according to the departmental dose-optimization program which includes automated exposure control, adjustment of the mA and/or kV according to patient size and/or use of iterative reconstruction technique. CONTRAST:  115m OMNIPAQUE IOHEXOL  300 MG/ML  SOLN COMPARISON:  None Available. FINDINGS: CT CHEST FINDINGS Cardiovascular: No evidence of aortic dissection or transsection. No pericardial fluid. Mediastinum/Nodes: Trachea and esophagus normal. No mediastinal hematoma. Lungs/Pleura: No pneumothorax. No pulmonary contusion. Scattered atelectasis. Musculoskeletal: Minimally displaced fractures of the fifth, sixth, and seventh LEFT ribs laterally (image 62/series 507, for example). No pneumothorax associated with the rib fractures. CT ABDOMEN AND PELVIS FINDINGS Hepatobiliary: No focal hepatic lesion. No biliary ductal dilatation. Gallbladder is normal. Common bile duct is normal. Pancreas: Pancreas is normal. No ductal dilatation. No pancreatic inflammation. Spleen: Normal spleen Adrenals/urinary tract: Kidneys enhance symmetrically. Bladder intact Stomach/Bowel: Small hiatal hernia. Duodenum normal. No evidence of bowel injury. No fluid in the mesentery. Vascular/Lymphatic: No aortic injury Reproductive: Post hysterectomy Other: No free fluid. Musculoskeletal: No pelvic fracture IMPRESSION: CHEST IMPRESSION: 1. Minimally displaced fractures of the LEFT fifth, sixth and seventh ribs. 2. No pneumothorax. 3. No pulmonary contusion. 4. No aortic injury. PELVIS IMPRESSION: 1. No evidence of trauma in the abdomen pelvis. 2. No pelvic fracture. Electronically Signed   By: SSuzy BouchardM.D.   On: 02/06/2023 18:34    Microbiology: No results found for this or any previous visit (from the past 240 hour(s)).   Labs: Basic Metabolic Panel: Recent Labs  Lab 02/06/23 1439 02/07/23 1206 02/07/23 1528 02/08/23 0446  NA 133* 137  --  140  K 3.3* 3.5  --  4.7  CL 105 113*  --  114*  CO2 22 20*  --  22  GLUCOSE 148* 101*  --  113*  BUN 36* 15  --  12  CREATININE 1.08* 0.70  --  0.75  CALCIUM 8.6* 7.9*  --  8.2*  MG  --   --  2.1  --    Liver Function Tests: Recent Labs  Lab 02/06/23 1439 02/07/23 1206  AST 22 23  ALT 16 15  ALKPHOS 51  44  BILITOT 0.7 0.6  PROT 6.4* 5.3*  ALBUMIN 3.8 3.3*   No results for input(s): "LIPASE", "AMYLASE" in the last 168 hours. No results for input(s): "AMMONIA" in the last 168 hours. CBC: Recent Labs  Lab 02/06/23 1439 02/06/23 2316 02/07/23 1206 02/07/23 1528 02/08/23 0446  WBC 12.7* 10.4 7.9 7.5 8.2  NEUTROABS 10.5*  --   --   --   --   HGB 8.5* 7.2* 9.4* 9.3* 9.7*  HCT 26.0* 21.3* 28.5* 27.9* 29.8*  MCV 99.2 99.1 96.9 95.9 97.1  PLT  238 179 161 163 190   Cardiac Enzymes: No results for input(s): "CKTOTAL", "CKMB", "CKMBINDEX", "TROPONINI" in the last 168 hours. BNP: BNP (last 3 results) No results for input(s): "BNP" in the last 8760 hours.  ProBNP (last 3 results) No results for input(s): "PROBNP" in the last 8760 hours.  CBG: No results for input(s): "GLUCAP" in the last 168 hours.     Signed:  Irine Seal MD.  [facility] 02/08/2023, 2:37 PM

## 2023-03-20 ENCOUNTER — Telehealth: Payer: Self-pay | Admitting: Gastroenterology

## 2023-03-20 NOTE — Telephone Encounter (Signed)
The pt has been advised that she had a prescription sent by Dr Janee Morn for #30 with 1 refill. She should have enough to last until her appt with Marchelle Folks on 4/26. She will call and let us know if she does not.

## 2023-03-20 NOTE — Telephone Encounter (Signed)
PT called regarding her protonix. She doesn't think she will have enough to last her and wants to discuss this. Please advise.

## 2023-04-06 NOTE — Progress Notes (Signed)
04/07/2023 Dana Waters [id number] 1945/12/19  Referring provider: Angelica Chessman, MD Primary GI doctor: Dr. Marina Goodell  ASSESSMENT AND PLAN:   Acute gastric ulcer with hemorrhage, esophagitis, GERD Likely NSAIDS induced EGD inpatient Dr. Russella Dar grade B esophagitis, benign stenosis, gastric ulcers Repeat CBC 04/09 show normal HGB, she is not on iron at this time -Will refill protonix for now, uncertain if we want to continue or not, further recommendations after EGD with Dr. Marina Goodell - Avoid NSAIDS/ETOH -Will schedule EGD with colonoscopy at [facility]. I discussed risks of EGD with patient today, including risk of sedation, bleeding or perforation.  Patient provides understanding and gave verbal consent to proceed.  Occult blood in stools and personal history of TA polyps Most likely FOBT is from gastric ulcers however last colon 2012.  Will schedule colonoscopy with Dr. Marina Goodell with repeat EGD to evaluate FOBT, rule out malignancy, AVM, etc.  Appropriate at [facility] We have discussed the risks of bleeding, infection, perforation, medication reactions, and remote risk of death associated with colonoscopy. All questions were answered and the patient acknowledges these risk and wishes to proceed.  B12 def Get on B12  Patient Care Team: Angelica Chessman, MD as PCP - General (Family Medicine)  HISTORY OF PRESENT ILLNESS: 77 y.o. female with a past medical history of osteoarthritis, restless leg, personal history of tubular adenomatous polyps, diverticulosis and others listed below presents for evaluation of hospital follow-up for GI bleeding.   Colonoscopy 04/2011 with Dr. Marina Goodell: Diminutive polyp and diverticulosis. Polyp was a tubular adenoma.   2/26 through 2/28 admission for syncopal episode with GI bleeding and broke 3 ribs.  Hemoglobin found to be 8.5 down from 13 g in September.  Patient received PRBCs.   Patient was on NSAIDs at the time. B12 also low normal at 233. 02/07/2023 EGD with  Dr. Russella Dar found LA grade B esophagitis benign-appearing intrinsic mild stenosis, small hiatal hernia, nonbleeding cratered gastric ulcers with clean bases greater curvature of gastric antrum largest lesion 6 mm multiple small erosions nonbleeding no stigmata of recent bleeding gastric antrum normal duodenum 02/24/2023 normal kidney function, alk phos 122, Hgb 12.4. 03/21/2023 CBC showed Hgb 13.1, platelets 268, WBC 4.8.  Patient presents for follow-up for repeat endoscopy to evaluate for gastric ulcer healing as well as potentially repeat colonoscopy due to anemia. She has not been on any NSAIDS, she is on tylenol.  Since she has been taking protonix once a day, just took the last one this AM her indigestions/burping symptoms have improved.  Has some dysphagia with large capsules only but no issues with food.  No AB pain.  She states right after the hospital she has been more constipated. She took senokot after the hospital and this has helped some.  She has BM every other day, have been slightly harder than the hospital. She is not on anything at this time for her stool.  Denies melena or hematochezia.  She has lost a little bit of weight since she fell and broke her left ribs, decreased appetite.  Denies SOB, CP, coughing, wheezing.    Paternal aunt with colon cancer, otherwise no cancer.  She denies blood thinner use.  She denies NSAID use.  She denies ETOH use since hospital, was drinking socially and rare.  She denies tobacco use.  She denies drug use.    She  reports that she has never smoked. She has never used smokeless tobacco. She reports current alcohol use of about 3.0 standard drinks  of alcohol per week. She reports that she does not use drugs.  RELEVANT LABS AND IMAGING: CBC    Component Value Date/Time   WBC 8.2 02/08/2023 0446   RBC 3.07 (L) 02/08/2023 0446   HGB 9.7 (L) 02/08/2023 0446   HCT 29.8 (L) 02/08/2023 0446   PLT 190 02/08/2023 0446   MCV 97.1 02/08/2023 0446    MCH 31.6 02/08/2023 0446   MCHC 32.6 02/08/2023 0446   RDW 15.5 02/08/2023 0446   LYMPHSABS 1.4 02/06/2023 1439   MONOABS 0.7 02/06/2023 1439   EOSABS 0.0 02/06/2023 1439   BASOSABS 0.0 02/06/2023 1439   Recent Labs    02/06/23 1439 02/06/23 2316 02/07/23 1206 02/07/23 1528 02/08/23 0446  HGB 8.5* 7.2* 9.4* 9.3* 9.7*    CMP     Component Value Date/Time   NA 140 02/08/2023 0446   K 4.7 02/08/2023 0446   CL 114 (H) 02/08/2023 0446   CO2 22 02/08/2023 0446   GLUCOSE 113 (H) 02/08/2023 0446   BUN 12 02/08/2023 0446   CREATININE 0.75 02/08/2023 0446   CALCIUM 8.2 (L) 02/08/2023 0446   PROT 5.3 (L) 02/07/2023 1206   ALBUMIN 3.3 (L) 02/07/2023 1206   AST 23 02/07/2023 1206   ALT 15 02/07/2023 1206   ALKPHOS 44 02/07/2023 1206   BILITOT 0.6 02/07/2023 1206   GFRNONAA >60 02/08/2023 0446      Latest Ref Rng & Units 02/07/2023   12:06 PM 02/06/2023    2:39 PM  Hepatic Function  Total Protein 6.5 - 8.1 g/dL 5.3  6.4   Albumin 3.5 - 5.0 g/dL 3.3  3.8   AST 15 - 41 U/L 23  22   ALT 0 - 44 U/L 15  16   Alk Phosphatase 38 - 126 U/L 44  51   Total Bilirubin 0.3 - 1.2 mg/dL 0.6  0.7       Current Medications:      Current Outpatient Medications (Analgesics):    acetaminophen (TYLENOL) 325 MG tablet, Take 2 tablets (650 mg total) by mouth every 6 (six) hours as needed for mild pain (or Fever >/= 101).   Current Outpatient Medications (Other):    CALCIUM-VITAMIN D PO, Take 1 tablet by mouth daily.   citalopram (CELEXA) 20 MG tablet, Take 20 mg by mouth at bedtime.    gabapentin (NEURONTIN) 100 MG capsule, Take 100 mg by mouth 3 (three) times daily.   Multiple Vitamins-Minerals (ALIVE WOMENS 50+) TABS, Take 1 tablet by mouth daily as needed (when remembers).   zolpidem (AMBIEN) 5 MG tablet, Take 5 mg by mouth at bedtime as needed for sleep.   pantoprazole (PROTONIX) 40 MG tablet, Take 1 tablet (40 mg total) by mouth daily.  Medical History:  Past Medical History:   Diagnosis Date   Acute gastric ulcer with hemorrhage    Allergies: No Known Allergies   Surgical History:  She  has a past surgical history that includes Abdominal hysterectomy; Tonsillectomy; Esophagogastroduodenoscopy (egd) with propofol (N/A, 02/07/2023); and biopsy (02/07/2023). Family History:  Her family history includes Colon cancer in her paternal aunt; Hypertension in her father, mother, and sister; Stroke in her mother.  REVIEW OF SYSTEMS  : All other systems reviewed and negative except where noted in the History of Present Illness.  PHYSICAL EXAM: BP 110/72   Pulse 99   Ht 5\' 1"  (1.549 m)   Wt 118 lb 9.6 oz (53.8 kg)   BMI 22.41 kg/m  General Appearance: Well nourished,  in no apparent distress. Head:   Normocephalic and atraumatic. Eyes:  sclerae anicteric,conjunctive pink  Respiratory: Respiratory effort normal, BS equal bilaterally without rales, rhonchi, wheezing. Cardio: RRR with no MRGs. Peripheral pulses intact.  Abdomen: Soft,  Non-distended ,active bowel sounds. No tenderness . Without guarding and Without rebound. No masses. Rectal: Not evaluated Musculoskeletal: Full ROM, Normal gait. Without edema. Skin:  Dry and intact without significant lesions or rashes Neuro: Alert and  oriented x4;  No focal deficits. Psych:  Cooperative. Normal mood and affect.    Doree Albee, PA-C 10:57 AM

## 2023-04-07 ENCOUNTER — Ambulatory Visit (INDEPENDENT_AMBULATORY_CARE_PROVIDER_SITE_OTHER): Payer: Medicare Other | Admitting: Physician Assistant

## 2023-04-07 ENCOUNTER — Encounter: Payer: Self-pay | Admitting: Physician Assistant

## 2023-04-07 VITALS — BP 110/72 | HR 99 | Ht 61.0 in | Wt 118.6 lb

## 2023-04-07 DIAGNOSIS — Z86010 Personal history of colonic polyps: Secondary | ICD-10-CM

## 2023-04-07 DIAGNOSIS — K209 Esophagitis, unspecified without bleeding: Secondary | ICD-10-CM | POA: Diagnosis not present

## 2023-04-07 DIAGNOSIS — E538 Deficiency of other specified B group vitamins: Secondary | ICD-10-CM

## 2023-04-07 DIAGNOSIS — K250 Acute gastric ulcer with hemorrhage: Secondary | ICD-10-CM | POA: Diagnosis not present

## 2023-04-07 DIAGNOSIS — R195 Other fecal abnormalities: Secondary | ICD-10-CM

## 2023-04-07 MED ORDER — NA SULFATE-K SULFATE-MG SULF 17.5-3.13-1.6 GM/177ML PO SOLN: 1.0000 | Freq: Once | ORAL | 0 refills | Status: AC

## 2023-04-07 MED ORDER — PANTOPRAZOLE SODIUM 40 MG PO TBEC: 40.0000 mg | DELAYED_RELEASE_TABLET | Freq: Every day | ORAL | 3 refills | Status: DC

## 2023-04-07 NOTE — Patient Instructions (Addendum)
B12 is mainly in meat, so increase meat can help this but often people have a deficiency that they are just not absorbing it well with meat or pills, so get the sublingual/melt in your mouth one.   If the sublingual one dose not increase your level, we will discuss shots.   Vitamin B12 Deficiency Vitamin B12 deficiency occurs when the body does not have enough vitamin B12, which is an important vitamin. The body needs this vitamin: To make red blood cells. To make DNA. This is the genetic material inside cells. To help the nerves work properly so they can carry messages from the brain to the body. Vitamin B12 deficiency can cause various health problems, such as a low red blood cell count (anemia) or nerve damage. What are the causes? This condition may be caused by: Not eating enough foods that contain vitamin B12. Not having enough stomach acid and digestive fluids to properly absorb vitamin B12 from the food that you eat. Certain digestive system diseases that make it hard to absorb vitamin B12. These diseases include Crohn's disease, chronic pancreatitis, and cystic fibrosis. A condition in which the body does not make enough of a protein (intrinsic factor), resulting in too few red blood cells (pernicious anemia). Having a surgery in which part of the stomach or small intestine is removed. Taking certain medicines that make it hard for the body to absorb vitamin B12. These medicines include: Heartburn medicines (antacids and proton pump inhibitors). Certain antibiotic medicines. Some medicines that are used to treat diabetes, tuberculosis, gout, or high cholesterol. What increases the risk? The following factors may make you more likely to develop a B12 deficiency: Being older than age 41. Eating a vegetarian or vegan diet, especially while you are pregnant. Eating a poor diet while you are pregnant. Taking certain medicines. Having alcoholism. What are the signs or symptoms? In  some cases, there are no symptoms of this condition. If the condition leads to anemia or nerve damage, various symptoms can occur, such as: Weakness. Fatigue. Loss of appetite. Weight loss. Numbness or tingling in your hands and feet. Redness and burning of the tongue. Confusion or memory problems. Depression. Sensory problems, such as color blindness, ringing in the ears, or loss of taste. Diarrhea or constipation. Trouble walking. If anemia is severe, symptoms can include: Shortness of breath. Dizziness. Rapid heart rate (tachycardia). How is this diagnosed? This condition may be diagnosed with a blood test to measure the level of vitamin B12 in your blood. You may also have other tests, including: A group of tests that measure certain characteristics of blood cells (complete blood count, CBC). A blood test to measure intrinsic factor. A procedure where a thin tube with a camera on the end is used to look into your stomach or intestines (endoscopy). Other tests may be needed to discover the cause of B12 deficiency. How is this treated? Treatment for this condition depends on the cause. This condition may be treated by: Changing your eating and drinking habits, such as: Eating more foods that contain vitamin B12. Drinking less alcohol or no alcohol. Getting vitamin B12 injections. Taking vitamin B12 supplements. Your health care provider will tell you which dosage is best for you. Follow these instructions at home: Eating and drinking  Eat lots of healthy foods that contain vitamin B12, including: Meats and poultry. This includes beef, pork, chicken, [place], and organ meats, such as liver. Seafood. This includes clams, rainbow trout, salmon, tuna, and haddock. Eggs. Cereal and  dairy products that are fortified. This means that vitamin B12 has been added to the food. Check the label on the package to see if the food is fortified. The items listed above may not be a complete list  of recommended foods and beverages. Contact a dietitian for more information. General instructions Get any injections that are prescribed by your health care provider. Take supplements only as told by your health care provider. Follow the directions carefully. Do not drink alcohol if your health care provider tells you not to. In some cases, you may only be asked to limit alcohol use. Keep all follow-up visits as told by your health care provider. This is important. Contact a health care provider if: Your symptoms come back. Get help right away if you: Develop shortness of breath. Have a rapid heart rate. Have chest pain. Become dizzy or lose consciousness. Summary Vitamin B12 deficiency occurs when the body does not have enough vitamin B12. The main causes of vitamin B12 deficiency include dietary deficiency, digestive diseases, pernicious anemia, and having a surgery in which part of the stomach or small intestine is removed. In some cases, there are no symptoms of this condition. If the condition leads to anemia or nerve damage, various symptoms can occur, such as weakness, shortness of breath, and numbness. Treatment may include getting vitamin B12 injections or taking vitamin B12 supplements. Eat lots of healthy foods that contain vitamin B12. This information is not intended to replace advice given to you by your health care provider. Make sure you discuss any questions you have with your health care provider. Document Revised: 05/17/2019 Document Reviewed: 08/07/2018 Elsevier Patient Education  2020 [employer].  You have been scheduled for an endoscopy and colonoscopy. Please follow the written instructions given to you at your visit today. Please pick up your prep supplies at the pharmacy within the next 1-3 days. If you use inhalers (even only as needed), please bring them with you on the day of your procedure.   Due to recent changes in healthcare laws, you may see the results  of your imaging and laboratory studies on MyChart before your provider has had a chance to review them.  We understand that in some cases there may be results that are confusing or concerning to you. Not all laboratory results come back in the same time frame and the provider may be waiting for multiple results in order to interpret others.  Please give [place] 48 hours in order for your provider to thoroughly review all the results before contacting the office for clarification of your results.    I appreciate the  opportunity to care for you  Thank You   [facility]

## 2023-04-07 NOTE — Progress Notes (Signed)
Noted  

## 2023-05-17 ENCOUNTER — Encounter: Payer: Medicare Other | Admitting: Internal Medicine

## 2023-10-18 ENCOUNTER — Other Ambulatory Visit: Payer: Self-pay | Admitting: Family Medicine

## 2023-10-18 DIAGNOSIS — Z1231 Encounter for screening mammogram for malignant neoplasm of breast: Secondary | ICD-10-CM

## 2023-11-02 ENCOUNTER — Other Ambulatory Visit: Payer: Self-pay | Admitting: Family Medicine

## 2023-11-02 DIAGNOSIS — M8580 Other specified disorders of bone density and structure, unspecified site: Secondary | ICD-10-CM

## 2023-11-03 ENCOUNTER — Ambulatory Visit
Admission: RE | Admit: 2023-11-03 | Discharge: 2023-11-03 | Disposition: A | Discharge status: Home / Self Care | Payer: Medicare Other | Source: Ambulatory Visit | Attending: Family Medicine | Admitting: Family Medicine

## 2023-11-03 DIAGNOSIS — M8580 Other specified disorders of bone density and structure, unspecified site: Secondary | ICD-10-CM

## 2023-11-07 ENCOUNTER — Ambulatory Visit
Admission: RE | Admit: 2023-11-07 | Discharge: 2023-11-07 | Disposition: A | Discharge status: Home / Self Care | Payer: Medicare Other | Source: Ambulatory Visit | Attending: Family Medicine | Admitting: Family Medicine

## 2023-11-07 DIAGNOSIS — Z1231 Encounter for screening mammogram for malignant neoplasm of breast: Secondary | ICD-10-CM

## 2024-03-19 NOTE — Progress Notes (Unsigned)
 Dana Amy, PA-C [address] [place], [place]  [address] Phone: [phone]   Primary Care Physician: Dana Hoit, MD  Primary Gastroenterologist: Dana Amy, PA-C / Dr. Yancey Waters  Chief Complaint: Follow-up gastric ulcer; discuss repeat EGD and colonoscopy.       HPI:   Dana Waters is a 78 y.o. female, returns for follow-up of gastric ulcers, and erosive esophagitis.  Also schedule a repeat colonoscopy.  Patient was admitted to [facility] 01/2023 for upper GI bleed due to gastric ulcers (due to NSAIDS).  She had acute blood loss anemia with hemoglobin 7.2g and required 2 units PRBC.  Posttransfusion hemoglobin improved to 9.4g.    10/2023 most recent labs: Hemoglobin 13.1, MCV 99.5.  Normal CMP and TSH.  01/2023 EGD by Dr. Russella Waters: LA grade B esophagitis with no bleeding at GE junction.  1 benign mild distal esophageal stenosis at GEJ.  Small hiatal hernia.  Two 6 mm nonbleeding gastric ulcers (Forrest class III).  Multiple other dispersed small gastric erosions at the gastric antrum.  Normal duodenum.  Biopsies negative for H. pylori.  Patient was advised to avoid NSAIDs and started on pantoprazole 40 Mg once daily.  Recommended repeat EGD in 2 to 3 months for follow-up of gastric ulcers, which is overdue.  04/2011 screening Colonoscopy by Dr. Marina Waters: Excellent prep (with movie prep).  1 small diminutive tubular adenoma polyp removed from ascending colon.  Sigmoid diverticulosis.  Otherwise normal.  7-year repeat (overdue).  Paternal aunt had colon cancer.  She vomited GoLytely prep.  She is requesting SuTab pill prep.  01/2023 CT abdomen pelvis with contrast: No acute abnormality.  Small hiatal hernia.  Prior hysterectomy.  Current symptoms: She has not had any more symptoms of gastric ulcers, upper GI bleeding, or anemia in the past year.  She remains on Pantoprazole 40mg  1 tablet daily and is avoiding all NSAIDS.  Needs refill of Pantoprazole.  GERD and belching  improved on PPI.  Denies dysphagia or breakthrough heartburn.  She denies abdominal pain, hematochezia or melena.  Admits to mild intermittent constipation for many years.  Has BM Q2-3 days.  No treatment.  No concerns today.  Current Outpatient Medications  Medication Sig Dispense Refill   acetaminophen (TYLENOL) 325 MG tablet Take 2 tablets (650 mg total) by mouth every 6 (six) hours as needed for mild pain (or Fever >/= 101).     atorvastatin (LIPITOR) 10 MG tablet Take 10 mg by mouth daily.     CALCIUM-VITAMIN D PO Take 1 tablet by mouth daily.     cimetidine (TAGAMET) 200 MG tablet Take 200 mg by mouth as needed.     citalopram (CELEXA) 20 MG tablet Take 20 mg by mouth at bedtime.      gabapentin (NEURONTIN) 100 MG capsule Take 100 mg by mouth 3 (three) times daily.     Multiple Vitamins-Minerals (ALIVE WOMENS 50+) TABS Take 1 tablet by mouth daily as needed (when remembers).     polyethylene glycol (MIRALAX) 17 g packet Take 17 g by mouth daily.     Sodium Sulfate-Mag Sulfate-KCl (SUTAB) [id number] MG TABS Use as directed for colonoscopy. MANUFACTURER CODES!! BIN: [id number] PCN: CN GROUP: [id number] MEMBER ID: [id number];YQI AS SECONDARY INSURANCE ;NO PRIOR AUTHORIZATION 24 tablet 0   zolpidem (AMBIEN) 5 MG tablet Take 5 mg by mouth at bedtime as needed for sleep.     pantoprazole (PROTONIX) 40 MG tablet Take 1 tablet (40 mg total) by mouth  daily. 90 tablet 3   No current facility-administered medications for this visit.    Allergies as of 03/20/2024   (No Known Allergies)    Past Medical History:  Diagnosis Date   Acute gastric ulcer with hemorrhage    Diverticulosis    GERD (gastroesophageal reflux disease)    History of adenomatous polyp of colon 2012   Dr. Marina Waters   Hyperlipidemia     Past Surgical History:  Procedure Laterality Date   ABDOMINAL HYSTERECTOMY     total   BIOPSY  02/07/2023   Procedure: BIOPSY;  Surgeon: Dana Dare, MD;  Location: [facility];   Service: Gastroenterology;;   ESOPHAGOGASTRODUODENOSCOPY (EGD) WITH PROPOFOL N/A 02/07/2023   Procedure: ESOPHAGOGASTRODUODENOSCOPY (EGD) WITH PROPOFOL;  Surgeon: Dana Dare, MD;  Location: [facility];  Service: Gastroenterology;  Laterality: N/A;   TONSILLECTOMY      Review of Systems:    All systems reviewed and negative except where noted in HPI.    Physical Exam:  BP 124/70   Pulse 80   Ht 5\' 2"  (1.575 m)   Wt 117 lb (53.1 kg)   SpO2 98%   BMI 21.40 kg/m  No LMP recorded. Patient has had a hysterectomy.  General: Well-nourished, well-developed in no acute distress.  Lungs: Clear to auscultation bilaterally. Non-labored. Heart: Regular rate and rhythm, no murmurs rubs or gallops.  Abdomen: Bowel sounds are normal; Abdomen is Soft; No hepatosplenomegaly, masses or hernias;  No Abdominal Tenderness; No guarding or rebound tenderness. Neuro: Alert and oriented x 3.  Grossly intact.  Psych: Alert and cooperative, normal mood and affect.   Imaging Studies: No results found.  Assessment and Plan:   Dana Waters is a 78 y.o. y/o female returns for 1 year follow-up of acute upper GI bleed and acute blood loss anemia due to erosive gastropathy which required hospitalization and blood transfusion 01/2023.  Treated with PPI and avoiding NSAIDs.  Currently stable and improved.  Recent hemoglobin improved to 13g.  Scheduling repeat EGD to verify healing of ulcers.  She is overdue for repeat colonoscopy.  1.  History of Gastric ulcers / erosive gastropathy: Found on EGD 01/2023.  Biopsies negative for H. pylori.  Scheduling EGD to verify healing of gastric ulcers on PPI. I discussed risks of EGD with patient to include risk of bleeding, perforation, and risk of sedation.  Patient expressed understanding and agrees to proceed with EGD.   Continue to avoid all NSAIDs.  Continue pantoprazole 40 Mg once daily, #90, 3 RF.  2.  GERD with LA grade B esophagitis  Recommend Lifestyle  Modifications to prevent Acid Reflux.  Rec. Avoid coffee, sodas, peppermint, garlic, onions, alcohol, citrus fruits, chocolate, tomatoes, fatty and spicey foods.  Avoid eating 2-3 hours before bedtime.    Continue pantoprazole 40 Mg daily.  3.  Constipation, Mild Recommend High Fiber diet with fruits, vegetables, and whole grains. Drink 64 ounces of Fluids Daily. Start Miralax Mix 1 capful in a drink daily.   4.  History of Adenomatous Colon Polyp / Colon Cancer Screening  Scheduling Colonoscopy I discussed risks of colonoscopy with patient to include risk of bleeding, colon perforation, and risk of sedation.  Patient expressed understanding and agrees to proceed with colonoscopy.   She vomited GoLytely prep in the past.   She is requesting SuTab pill prep - instructions given.  Dana Amy, PA-C  Follow up As Needed based on Procedure results and GI symptoms.

## 2024-03-20 ENCOUNTER — Ambulatory Visit: Payer: Medicare Other | Admitting: Physician Assistant

## 2024-03-20 VITALS — BP 124/70 | HR 80 | Ht 62.0 in | Wt 117.0 lb

## 2024-03-20 DIAGNOSIS — Z860100 Personal history of colon polyps, unspecified: Secondary | ICD-10-CM

## 2024-03-20 DIAGNOSIS — Z1211 Encounter for screening for malignant neoplasm of colon: Secondary | ICD-10-CM

## 2024-03-20 DIAGNOSIS — Z8711 Personal history of peptic ulcer disease: Secondary | ICD-10-CM | POA: Diagnosis not present

## 2024-03-20 DIAGNOSIS — Z860101 Personal history of adenomatous and serrated colon polyps: Secondary | ICD-10-CM | POA: Diagnosis not present

## 2024-03-20 DIAGNOSIS — K5900 Constipation, unspecified: Secondary | ICD-10-CM

## 2024-03-20 DIAGNOSIS — K2100 Gastro-esophageal reflux disease with esophagitis, without bleeding: Secondary | ICD-10-CM

## 2024-03-20 MED ORDER — POLYETHYLENE GLYCOL 3350 17 G PO PACK: 17.0000 g | PACK | Freq: Every day | ORAL | Status: AC

## 2024-03-20 MED ORDER — SUTAB 1479-225-188 MG PO TABS: ORAL_TABLET | ORAL | 0 refills | Status: DC

## 2024-03-20 MED ORDER — PANTOPRAZOLE SODIUM 40 MG PO TBEC
40.0000 mg | DELAYED_RELEASE_TABLET | Freq: Every day | ORAL | 3 refills | Status: DC
Start: 2024-03-20 — End: 2024-06-03

## 2024-03-20 NOTE — Progress Notes (Signed)
 Noted.

## 2024-03-20 NOTE — Patient Instructions (Addendum)
 You have been scheduled for a EGD/colonoscopy with Dr. Marina Goodell. Please follow written instructions given to you at your visit today.   If you use inhalers (even only as needed), please bring them with you on the day of your procedure.  DO NOT TAKE 7 DAYS PRIOR TO TEST- Trulicity (dulaglutide) Ozempic, Wegovy (semaglutide) Mounjaro (tirzepatide) Bydureon Bcise (exanatide extended release)  DO NOT TAKE 1 DAY PRIOR TO YOUR TEST Rybelsus (semaglutide) Adlyxin (lixisenatide) Victoza (liraglutide) Byetta (exanatide) ________________________________________________________________________  Please purchase the following medications over the counter and take as directed: Miralax: Take once daily  We have sent the following medications to your pharmacy for you to pick up at your convenience: Pantoprazole  Thank you for entrusting me with your care and for choosing [facility], [place], [place]   If your blood pressure at your visit was 140/90 or greater, please contact your primary care physician to follow up on this. ______________________________________________________  If you are age 77 or older, your body mass index should be between 23-30. Your Body mass index is 21.4 kg/m. If this is out of the aforementioned range listed, please consider follow up with your Primary Care Provider.  If you are age 75 or younger, your body mass index should be between 19-25. Your Body mass index is 21.4 kg/m. If this is out of the aformentioned range listed, please consider follow up with your Primary Care Provider.  ________________________________________________________  The [facility] providers would like to encourage you to use [facility] to communicate with providers for non-urgent requests or questions.  Due to long hold times on the telephone, sending your provider a message by [facility] may be a faster and more efficient way to get a response.  Please allow 48 business hours for a response.   Please remember that this is for non-urgent requests.  _______________________________________________________  Due to recent changes in healthcare laws, you may see the results of your imaging and laboratory studies on MyChart before your provider has had a chance to review them.  We understand that in some cases there may be results that are confusing or concerning to you. Not all laboratory results come back in the same time frame and the provider may be waiting for multiple results in order to interpret others.  Please give [place] 48 hours in order for your provider to thoroughly review all the results before contacting the office for clarification of your results.

## 2024-04-05 ENCOUNTER — Other Ambulatory Visit: Payer: Self-pay | Admitting: Family Medicine

## 2024-04-05 ENCOUNTER — Other Ambulatory Visit: Payer: Self-pay

## 2024-04-05 MED ORDER — ATORVASTATIN CALCIUM 10 MG PO TABS: 10.0000 mg | ORAL_TABLET | Freq: Every day | ORAL | 0 refills | Status: DC

## 2024-04-05 NOTE — Telephone Encounter (Signed)
 Copied from CRM [id number]. Topic: Clinical - Medication Refill >> Apr 05, 2024  2:04 PM Star [place] wrote: Most Recent Primary Care Visit:   Medication: atorvastatin (LIPITOR) 10 MG tablet zolpidem  (AMBIEN ) 5 MG tablet  Has the patient contacted their pharmacy? No (Agent: If no, request that the patient contact the pharmacy for the refill. If patient does not wish to contact the pharmacy document the reason why and proceed with request.) (Agent: If yes, when and what did the pharmacy advise?)  Is this the correct pharmacy for this prescription? Yes If no, delete pharmacy and type the correct one.  This is the patient's preferred pharmacy:  [facility] [place], [place] - [address] [place] [address] Phone: [phone] Fax: [phone]   Has the prescription been filled recently? Yes  Is the patient out of the medication? Yes  Has the patient been seen for an appointment in the last year OR does the patient have an upcoming appointment? Yes  Can we respond through MyChart? Yes  Agent: Please be advised that Rx refills may take up to 3 business days. We ask that you follow-up with your pharmacy.

## 2024-04-05 NOTE — Telephone Encounter (Signed)
 Requested medication (s) are due for refill today: Yes  Requested medication (s) are on the active medication list: Yes  Last refill:  Atorvastatin 03/20/24, Zolpidem  02/06/23  Future visit scheduled: Yes  Notes to clinic:  Unable to refill per protocol, last refill by another provider.      Requested Prescriptions  Pending Prescriptions Disp Refills   atorvastatin (LIPITOR) 10 MG tablet      Sig: Take 1 tablet (10 mg total) by mouth daily.     Cardiovascular:  Antilipid - Statins Failed - 04/05/2024  4:53 PM      Failed - Valid encounter within last 12 months    Recent Outpatient Visits           8 years ago Wound, open, nose, initial encounter   Primary Care at [facility], Dorthea Gauze, MD   10 years ago Pain in joint, ankle and foot   Primary Care at [facility], Skipper Dumas, MD              Failed - Lipid Panel in normal range within the last 12 months    No results found for: "CHOL", "POCCHOL", "CHOLTOT" No results found for: "LDLCALC", "LDLC", "HIRISKLDL", "POCLDL", "LDLDIRECT", "REALLDLC", "TOTLDLC" No results found for: "HDL", "POCHDL" No results found for: "TRIG", "POCTRIG"       Passed - Patient is not pregnant       zolpidem  (AMBIEN ) 5 MG tablet 30 tablet     Sig: Take 1 tablet (5 mg total) by mouth at bedtime as needed for sleep.     Not Delegated - Psychiatry:  Anxiolytics/Hypnotics Failed - 04/05/2024  4:53 PM      Failed - This refill cannot be delegated      Failed - Urine Drug Screen completed in last 360 days      Failed - Valid encounter within last 6 months    Recent Outpatient Visits           8 years ago Wound, open, nose, initial encounter   Primary Care at [facility], Dorthea Gauze, MD   10 years ago Pain in joint, ankle and foot   Primary Care at [facility], Skipper Dumas, MD

## 2024-04-08 MED ORDER — ZOLPIDEM TARTRATE 5 MG PO TABS: 5.0000 mg | ORAL_TABLET | Freq: Every evening | ORAL | 1 refills | Status: DC | PRN

## 2024-05-07 ENCOUNTER — Encounter: Admitting: Internal Medicine

## 2024-05-31 ENCOUNTER — Telehealth: Payer: Self-pay

## 2024-05-31 NOTE — Telephone Encounter (Signed)
 Copied from CRM [id number]. Topic: Clinical - Medication Question >> May 31, 2024  2:34 PM [place] E wrote: Reason for CRM: Patient has a colonoscopy on June 23rd and questioning if she can get a medication prescribed to help with the nausea. Callback number for patient is [phone].

## 2024-06-03 ENCOUNTER — Ambulatory Visit (AMBULATORY_SURGERY_CENTER): Admitting: Internal Medicine

## 2024-06-03 ENCOUNTER — Encounter: Payer: Self-pay | Admitting: Internal Medicine

## 2024-06-03 ENCOUNTER — Other Ambulatory Visit: Payer: Self-pay

## 2024-06-03 VITALS — BP 126/54 | HR 71 | Temp 98.1°F | Resp 12 | Ht 62.0 in | Wt 117.0 lb

## 2024-06-03 DIAGNOSIS — K449 Diaphragmatic hernia without obstruction or gangrene: Secondary | ICD-10-CM | POA: Diagnosis not present

## 2024-06-03 DIAGNOSIS — Z860100 Personal history of colon polyps, unspecified: Secondary | ICD-10-CM

## 2024-06-03 DIAGNOSIS — K219 Gastro-esophageal reflux disease without esophagitis: Secondary | ICD-10-CM | POA: Diagnosis not present

## 2024-06-03 DIAGNOSIS — Z1211 Encounter for screening for malignant neoplasm of colon: Secondary | ICD-10-CM

## 2024-06-03 DIAGNOSIS — K222 Esophageal obstruction: Secondary | ICD-10-CM | POA: Diagnosis not present

## 2024-06-03 DIAGNOSIS — K5730 Diverticulosis of large intestine without perforation or abscess without bleeding: Secondary | ICD-10-CM

## 2024-06-03 DIAGNOSIS — Z860101 Personal history of adenomatous and serrated colon polyps: Secondary | ICD-10-CM

## 2024-06-03 DIAGNOSIS — Z8711 Personal history of peptic ulcer disease: Secondary | ICD-10-CM

## 2024-06-03 MED ORDER — SODIUM CHLORIDE 0.9 % IV SOLN: 500.0000 mL | Freq: Once | INTRAVENOUS | Status: DC

## 2024-06-03 MED ORDER — ONDANSETRON HCL 4 MG PO TABS: 4.0000 mg | ORAL_TABLET | Freq: Three times a day (TID) | ORAL | 0 refills | Status: AC | PRN

## 2024-06-03 MED ORDER — PANTOPRAZOLE SODIUM 40 MG PO TBEC: 40.0000 mg | DELAYED_RELEASE_TABLET | Freq: Every day | ORAL | 3 refills | Status: AC

## 2024-06-03 NOTE — Op Note (Signed)
 [facility] Patient Name: Dana Waters Procedure Date: 06/03/2024 2:25 PM MRN: [id number] Endoscopist: Norleen SAILOR. Abran , MD, [id number] Age: 78 Referring MD:  Date of Birth: 1946/03/03 Gender: Female Account #: [id number] Procedure:                Colonoscopy Indications:              Screening for colorectal malignant neoplasm.                            Previous examination 2012 Medicines:                Monitored Anesthesia Care Procedure:                Pre-Anesthesia Assessment:                           - Prior to the procedure, a History and Physical                            was performed, and patient medications and                            allergies were reviewed. The patient's tolerance of                            previous anesthesia was also reviewed. The risks                            and benefits of the procedure and the sedation                            options and risks were discussed with the patient.                            All questions were answered, and informed consent                            was obtained. Prior Anticoagulants: The patient has                            taken no anticoagulant or antiplatelet agents. ASA                            Grade Assessment: II - A patient with mild systemic                            disease. After reviewing the risks and benefits,                            the patient was deemed in satisfactory condition to                            undergo the procedure.  After obtaining informed consent, the colonoscope                            was passed under direct vision. Throughout the                            procedure, the patient's blood pressure, pulse, and                            oxygen saturations were monitored continuously. The                            CF [id number] #[id number] was introduced through the anus                            and advanced to the the cecum,  identified by                            appendiceal orifice and ileocecal valve. The                            ileocecal valve, appendiceal orifice, and rectum                            were photographed. The quality of the bowel                            preparation was excellent. The colonoscopy was                            performed without difficulty. The patient tolerated                            the procedure well. The bowel preparation used was                            SUPREP via split dose instruction. Scope In: 2:30:17 PM Scope Out: 2:42:45 PM Scope Withdrawal Time: 0 hours 6 minutes 54 seconds  Total Procedure Duration: 0 hours 12 minutes 28 seconds  Findings:                 Multiple diverticula were found in the sigmoid                            colon.                           The exam was otherwise without abnormality on                            direct and retroflexion views. Complications:            No immediate complications. Estimated blood loss:  None. Estimated Blood Loss:     Estimated blood loss: none. Impression:               - Diverticulosis in the sigmoid colon.                           - The examination was otherwise normal on direct                            and retroflexion views.                           - No specimens collected. Recommendation:           - Repeat colonoscopy is not recommended for                            surveillance.                           - Patient has a contact number available for                            emergencies. The signs and symptoms of potential                            delayed complications were discussed with the                            patient. Return to normal activities tomorrow.                            Written discharge instructions were provided to the                            patient.                           - Resume previous diet.                            - Continue present medications. Norleen SAILOR. Abran, MD 06/03/2024 2:48:48 PM This report has been signed electronically.

## 2024-06-03 NOTE — Progress Notes (Signed)
 Bed reversed, prepared to start Endoscopy.

## 2024-06-03 NOTE — Progress Notes (Signed)
 To PACU via stretcher, sedated, good respiratory effort, VSS.

## 2024-06-03 NOTE — Patient Instructions (Addendum)
 Resume previous diet Continue present medications, including protonix (pantoprazole ) There were no colon polyps seen today!  You will NOT need another screening colonoscopy, HOWEVER,    Please call Dr Abran at [phone] if you have a change in bowel habits, change in family history of colo-rectal cancer, rectal bleeding or other GI concern in the future!  Handouts/information given for GERD, Hiatal Hernia, diverticulosis   YOU HAD AN ENDOSCOPIC PROCEDURE TODAY AT THE [facility]:   Refer to the procedure report that was given to you for any specific questions about what was found during the examination.  If the procedure report does not answer your questions, please call your gastroenterologist to clarify.  If you requested that your care partner not be given the details of your procedure findings, then the procedure report has been included in a sealed envelope for you to review at your convenience later.  YOU SHOULD EXPECT: Some feelings of bloating in the abdomen. Passage of more gas than usual.  Walking can help get rid of the air that was put into your GI tract during the procedure and reduce the bloating. If you had a lower endoscopy (such as a colonoscopy or flexible sigmoidoscopy) you may notice spotting of blood in your stool or on the toilet paper. If you underwent a bowel prep for your procedure, you may not have a normal bowel movement for a few days.  Please Note:  You might notice some irritation and congestion in your nose or some drainage.  This is from the oxygen used during your procedure.  There is no need for concern and it should clear up in a day or so.  SYMPTOMS TO REPORT IMMEDIATELY:  Following lower endoscopy (colonoscopy):  Excessive amounts of blood in the stool  Significant tenderness or worsening of abdominal pains  Swelling of the abdomen that is new, acute  Fever of 100F or higher Following upper endoscopy (EGD)  Vomiting of blood or coffee ground  material  New chest pain or pain under the shoulder blades  Painful or persistently difficult swallowing  New shortness of breath  Black, tarry-looking stools For urgent or emergent issues, a gastroenterologist can be reached at any hour by calling [phone]. Do not use MyChart messaging for urgent concerns.   DIET:  We do recommend a small meal at first, but then you may proceed to your regular diet.  Drink plenty of fluids but you should avoid alcoholic beverages for 24 hours.  ACTIVITY:  You should plan to take it easy for the rest of today and you should NOT DRIVE or use heavy machinery until tomorrow (because of the sedation medicines used during the test).    FOLLOW UP: Our staff will call the number listed on your records the next business day following your procedure.  We will call around 7:15- 8:00 am to check on you and address any questions or concerns that you may have regarding the information given to you following your procedure. If we do not reach you, we will leave a message.      SIGNATURES/CONFIDENTIALITY: You and/or your care partner have signed paperwork which will be entered into your electronic medical record.  These signatures attest to the fact that that the information above on your After Visit Summary has been reviewed and is understood.  Full responsibility of the confidentiality of this discharge information lies with you and/or your care-partner.

## 2024-06-03 NOTE — Op Note (Signed)
 [facility] Patient Name: Dana Waters Procedure Date: 06/03/2024 2:26 PM MRN: [id number] Endoscopist: Norleen SAILOR. Abran , MD, [id number] Age: 78 Referring MD:  Date of Birth: 03-15-1946 Gender: Female Account #: [id number] Procedure:                Upper GI endoscopy Indications:              Esophageal reflux, Follow-up of acute gastric ulcer Medicines:                Monitored Anesthesia Care Procedure:                Pre-Anesthesia Assessment:                           - Prior to the procedure, a History and Physical                            was performed, and patient medications and                            allergies were reviewed. The patient's tolerance of                            previous anesthesia was also reviewed. The risks                            and benefits of the procedure and the sedation                            options and risks were discussed with the patient.                            All questions were answered, and informed consent                            was obtained. Prior Anticoagulants: The patient has                            taken no anticoagulant or antiplatelet agents. ASA                            Grade Assessment: II - A patient with mild systemic                            disease. After reviewing the risks and benefits,                            the patient was deemed in satisfactory condition to                            undergo the procedure.                           After obtaining informed consent, the endoscope was  passed under direct vision. Throughout the                            procedure, the patient's blood pressure, pulse, and                            oxygen saturations were monitored continuously. The                            GIF HQ190 #[id number] was introduced through the                            mouth, and advanced to the second part of duodenum.                            The  upper GI endoscopy was accomplished without                            difficulty. The patient tolerated the procedure                            well. Scope In: Scope Out: Findings:                 The esophagus revealed a large caliber distal                            esophageal ringlike stricture. Otherwise normal.                           The stomach was normal, save hiatal hernia. No                            active ulcer disease.                           The examined duodenum was normal.                           The cardia and gastric fundus were normal on                            retroflexion. Complications:            No immediate complications. Estimated Blood Loss:     Estimated blood loss: none. Impression:               1. GERD                           2. Asymptomatic esophageal stricture                           3. Hiatal hernia                           4. Otherwise normal EGD. No residual ulcer disease. Recommendation:           -  Patient has a contact number available for                            emergencies. The signs and symptoms of potential                            delayed complications were discussed with the                            patient. Return to normal activities tomorrow.                            Written discharge instructions were provided to the                            patient.                           - Resume previous diet.                           - Continue present medications. Continue                            pantoprazole  daily Norleen SAILOR. Abran, MD 06/03/2024 2:57:35 PM This report has been signed electronically.

## 2024-06-03 NOTE — Progress Notes (Signed)
 Expand All Collapse All       [name], PA-C [address] [place], [place]  [address] Phone: [phone]     Primary Care Physician: Aletha Bene, MD   Primary Gastroenterologist: [name], PA-C / Dr. Norleen Kiang   Chief Complaint: Follow-up gastric ulcer; discuss repeat EGD and colonoscopy.        HPI:   Dana Waters is a 78 y.o. female, returns for follow-up of gastric ulcers, and erosive esophagitis.  Also schedule a repeat colonoscopy.  Patient was admitted to [facility] 01/2023 for upper GI bleed due to gastric ulcers (due to NSAIDS).  She had acute blood loss anemia with hemoglobin 7.2g and required 2 units PRBC.  Posttransfusion hemoglobin improved to 9.4g.     10/2023 most recent labs: Hemoglobin 13.1, MCV 99.5.  Normal CMP and TSH.   01/2023 EGD by Dr. Aneita: LA grade B esophagitis with no bleeding at GE junction.  1 benign mild distal esophageal stenosis at GEJ.  Small hiatal hernia.  Two 6 mm nonbleeding gastric ulcers (Forrest class III).  Multiple other dispersed small gastric erosions at the gastric antrum.  Normal duodenum.  Biopsies negative for H. pylori.  Patient was advised to avoid NSAIDs and started on pantoprazole  40 Mg once daily.  Recommended repeat EGD in 2 to 3 months for follow-up of gastric ulcers, which is overdue.   04/2011 screening Colonoscopy by Dr. Kiang: Excellent prep (with movie prep).  1 small diminutive tubular adenoma polyp removed from ascending colon.  Sigmoid diverticulosis.  Otherwise normal.  7-year repeat (overdue).  Paternal aunt had colon cancer.  She vomited GoLytely  prep.  She is requesting SuTab  pill prep.   01/2023 CT abdomen pelvis with contrast: No acute abnormality.  Small hiatal hernia.  Prior hysterectomy.   Current symptoms: She has not had any more symptoms of gastric ulcers, upper GI bleeding, or anemia in the past year.  She remains on Pantoprazole  40mg  1 tablet daily and is avoiding all NSAIDS.  Needs refill of  Pantoprazole .  GERD and belching improved on PPI.  Denies dysphagia or breakthrough heartburn.  She denies abdominal pain, hematochezia or melena.  Admits to mild intermittent constipation for many years.  Has BM Q2-3 days.  No treatment.  No concerns today.         Current Outpatient Medications  Medication Sig Dispense Refill   acetaminophen  (TYLENOL ) 325 MG tablet Take 2 tablets (650 mg total) by mouth every 6 (six) hours as needed for mild pain (or Fever >/= 101).       atorvastatin  (LIPITOR) 10 MG tablet Take 10 mg by mouth daily.       CALCIUM -VITAMIN D PO Take 1 tablet by mouth daily.       cimetidine (TAGAMET) 200 MG tablet Take 200 mg by mouth as needed.       citalopram  (CELEXA ) 20 MG tablet Take 20 mg by mouth at bedtime.        gabapentin  (NEURONTIN ) 100 MG capsule Take 100 mg by mouth 3 (three) times daily.       Multiple Vitamins-Minerals (ALIVE WOMENS 50+) TABS Take 1 tablet by mouth daily as needed (when remembers).       polyethylene glycol (MIRALAX ) 17 g packet Take 17 g by mouth daily.       Sodium Sulfate-Mag Sulfate-KCl (SUTAB ) 1479-225-188 MG TABS Use as directed for colonoscopy. MANUFACTURER CODES!! BIN: [id number] PCN: CN GROUP: [id number] MEMBER ID: [id number];MLW AS SECONDARY INSURANCE ;NO PRIOR  AUTHORIZATION 24 tablet 0   zolpidem  (AMBIEN ) 5 MG tablet Take 5 mg by mouth at bedtime as needed for sleep.       pantoprazole  (PROTONIX ) 40 MG tablet Take 1 tablet (40 mg total) by mouth daily. 90 tablet 3      No current facility-administered medications for this visit.           Allergies as of 03/20/2024   (No Known Allergies)          Past Medical History:  Diagnosis Date   Acute gastric ulcer with hemorrhage     Diverticulosis     GERD (gastroesophageal reflux disease)     History of adenomatous polyp of colon 2012    Dr. Abran   Hyperlipidemia                 Past Surgical History:  Procedure Laterality Date   ABDOMINAL HYSTERECTOMY        total    BIOPSY   02/07/2023    Procedure: BIOPSY;  Surgeon: Aneita Gwendlyn DASEN, MD;  Location: [facility];  Service: Gastroenterology;;   ESOPHAGOGASTRODUODENOSCOPY (EGD) WITH PROPOFOL  N/A 02/07/2023    Procedure: ESOPHAGOGASTRODUODENOSCOPY (EGD) WITH PROPOFOL ;  Surgeon: Aneita Gwendlyn DASEN, MD;  Location: [facility];  Service: Gastroenterology;  Laterality: N/A;   TONSILLECTOMY              Review of Systems:    All systems reviewed and negative except where noted in HPI.      Physical Exam:  BP 124/70   Pulse 80   Ht 5' 2 (1.575 m)   Wt 117 lb (53.1 kg)   SpO2 98%   BMI 21.40 kg/m  No LMP recorded. Patient has had a hysterectomy.   General: Well-nourished, well-developed in no acute distress.  Lungs: Clear to auscultation bilaterally. Non-labored. Heart: Regular rate and rhythm, no murmurs rubs or gallops.  Abdomen: Bowel sounds are normal; Abdomen is Soft; No hepatosplenomegaly, masses or hernias;  No Abdominal Tenderness; No guarding or rebound tenderness. Neuro: Alert and oriented x 3.  Grossly intact.  Psych: Alert and cooperative, normal mood and affect.     Imaging Studies: Imaging Results  No results found.     Assessment and Plan:    [name] is a 78 y.o. y/o female returns for 1 year follow-up of acute upper GI bleed and acute blood loss anemia due to erosive gastropathy which required hospitalization and blood transfusion 01/2023.  Treated with PPI and avoiding NSAIDs.  Currently stable and improved.  Recent hemoglobin improved to 13g.  Scheduling repeat EGD to verify healing of ulcers.  She is overdue for repeat colonoscopy.   1.  History of Gastric ulcers / erosive gastropathy: Found on EGD 01/2023.  Biopsies negative for H. pylori.             Scheduling EGD to verify healing of gastric ulcers on PPI. I discussed risks of EGD with patient to include risk of bleeding, perforation, and risk of sedation.  Patient expressed understanding and agrees to proceed with EGD.               Continue to avoid all NSAIDs.             Continue pantoprazole  40 Mg once daily, #90, 3 RF.   2.  GERD with LA grade B esophagitis             Recommend Lifestyle Modifications to prevent Acid Reflux.  Rec. Avoid coffee,  sodas, peppermint, garlic, onions, alcohol, citrus fruits, chocolate, tomatoes, fatty and spicey foods.  Avoid eating 2-3 hours before bedtime.               Continue pantoprazole  40 Mg daily.   3.  Constipation, Mild Recommend High Fiber diet with fruits, vegetables, and whole grains. Drink 64 ounces of Fluids Daily. Start Miralax  Mix 1 capful in a drink daily.   4.  History of Adenomatous Colon Polyp / Colon Cancer Screening             Scheduling Colonoscopy I discussed risks of colonoscopy with patient to include risk of bleeding, colon perforation, and risk of sedation.  Patient expressed understanding and agrees to proceed with colonoscopy.              She vomited GoLytely  prep in the past.   She is requesting SuTab  pill prep - instructions given.   [name], PA-C    Recent H&P as above.  No interval change.  Now for colonoscopy and upper endoscopy

## 2024-06-04 ENCOUNTER — Telehealth: Payer: Self-pay

## 2024-06-04 NOTE — Telephone Encounter (Signed)
 Follow up call to pt, lm for pt to call if having any difficulty with normal activities or eating and drinking.  Also to call if any other questions or concerns.

## 2024-07-15 ENCOUNTER — Other Ambulatory Visit: Payer: Self-pay

## 2024-07-15 ENCOUNTER — Other Ambulatory Visit: Payer: Self-pay | Admitting: Family Medicine

## 2024-07-15 ENCOUNTER — Telehealth: Payer: Self-pay

## 2024-07-15 MED ORDER — CITALOPRAM HYDROBROMIDE 20 MG PO TABS: 20.0000 mg | ORAL_TABLET | Freq: Every day | ORAL | 1 refills | Status: AC

## 2024-07-15 MED ORDER — ZOLPIDEM TARTRATE 5 MG PO TABS: 5.0000 mg | ORAL_TABLET | Freq: Every evening | ORAL | 1 refills | Status: DC | PRN

## 2024-07-15 NOTE — Telephone Encounter (Signed)
 Copied from CRM #[id number]. Topic: Clinical - Medication Question >> Jul 15, 2024 11:00 AM Antwanette L wrote: Reason for CRM: Pt has newe office visit on 9/3. The pt needs refill on citalopram  (CELEXA ) 20 MG tablet  and zolpidem  (AMBIEN ) 5 MG tablet. The patient use to see. Dr. Aletha at her old practice and the pt has been waiting months to see to her. Please contact the patient at [phone].  Pharmacy Details [facility] [address] [place] [address] Phone: [phone] Fax: [phone] Hours: Not open 24 hours

## 2024-07-15 NOTE — Telephone Encounter (Signed)
 Sent in Celexa . Ambien  sent to PCP to delegate.

## 2024-07-15 NOTE — Telephone Encounter (Unsigned)
 Copied from CRM #[id number]. Topic: Clinical - Medication Refill >> Jul 15, 2024 10:56 AM Antwanette L wrote: Medication: zolpidem  (AMBIEN ) 5 MG tablet and citalopram  (CELEXA ) 20 MG tablet  Has the patient contacted their pharmacy? No  This is the patient's preferred pharmacy:  [facility] [place], [place] - [address] [place] [address] Phone: [phone] Fax: [phone]  Is this the correct pharmacy for this prescription? Yes   Has the prescription been filled recently? Yes. Last refill on zolpidem  was on 04/08/24  Is the patient out of the medication? Yes  Has the patient been seen for an appointment in the last year OR does the patient have an upcoming appointment? Yes.Pt has an upcoming appt on 9/3 with Dr.Aguair  Can we respond through MyChart? No.Contact the pt by phone at [phone]  Agent: Please be advised that Rx refills may take up to 3 business days. We ask that you follow-up with your pharmacy.

## 2024-07-16 NOTE — Telephone Encounter (Signed)
 Requested medications are due for refill today.  no  Requested medications are on the active medications list.  yes  Last refill. 07/15/2024  Future visit scheduled.   yes  Notes to clinic.  Refusal not delegated.    Requested Prescriptions  Pending Prescriptions Disp Refills   zolpidem  (AMBIEN ) 5 MG tablet 30 tablet 1    Sig: Take 1 tablet (5 mg total) by mouth at bedtime as needed for sleep.     Not Delegated - Psychiatry:  Anxiolytics/Hypnotics Failed - 07/16/2024  2:09 PM      Failed - This refill cannot be delegated      Failed - Urine Drug Screen completed in last 360 days      Failed - Valid encounter within last 6 months    Recent Outpatient Visits           8 years ago Wound, open, nose, initial encounter   Primary Care at [facility], [place] ORN, MD   10 years ago Pain in joint, ankle and foot   Primary Care at [facility], Harlene BROCKS, MD              Refused Prescriptions Disp Refills   citalopram  (CELEXA ) 20 MG tablet 90 tablet 1    Sig: Take 1 tablet (20 mg total) by mouth at bedtime.     Psychiatry:  Antidepressants - SSRI Failed - 07/16/2024  2:09 PM      Failed - Valid encounter within last 6 months    Recent Outpatient Visits           8 years ago Wound, open, nose, initial encounter   Primary Care at [facility], [place] ORN, MD   10 years ago Pain in joint, ankle and foot   Primary Care at [facility], Harlene BROCKS, MD

## 2024-07-16 NOTE — Telephone Encounter (Signed)
 Requested Prescriptions  Pending Prescriptions Disp Refills   zolpidem  (AMBIEN ) 5 MG tablet 30 tablet 1    Sig: Take 1 tablet (5 mg total) by mouth at bedtime as needed for sleep.     Not Delegated - Psychiatry:  Anxiolytics/Hypnotics Failed - 07/16/2024  2:07 PM      Failed - This refill cannot be delegated      Failed - Urine Drug Screen completed in last 360 days      Failed - Valid encounter within last 6 months    Recent Outpatient Visits           8 years ago Wound, open, nose, initial encounter   Primary Care at [facility], [place] ORN, MD   10 years ago Pain in joint, ankle and foot   Primary Care at [facility], Harlene BROCKS, MD              Refused Prescriptions Disp Refills   citalopram  (CELEXA ) 20 MG tablet 90 tablet 1    Sig: Take 1 tablet (20 mg total) by mouth at bedtime.     Psychiatry:  Antidepressants - SSRI Failed - 07/16/2024  2:07 PM      Failed - Valid encounter within last 6 months    Recent Outpatient Visits           8 years ago Wound, open, nose, initial encounter   Primary Care at [facility], [place] ORN, MD   10 years ago Pain in joint, ankle and foot   Primary Care at [facility], Harlene BROCKS, MD

## 2024-07-23 NOTE — Progress Notes (Signed)
 Ophthalmology Department Clinical Visit Note     CHIEF COMPLAINT Patient presents for Follow Up Exam   HISTORY OF PRESENT ILLNESS: Dana Waters is a 78 y.o. female who presents to the clinic today for a cataract evaluation. Referred back by Dr. Scarlet. Patient has a history of pterygium excision OU (OD 09/2011 and OS 09/2013 per Dr. Caresse).  Dana Waters reports her vision has declined since last November and always feel tired.  HPI     Follow Up Exam   Follow-Up For:: 1. 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 History of pterygium excision   .  In both eyes.        Comments   Her eyes are tired all the time. Distance vision isn't as good as it was and reading is not happening . Not blurry, but not as sharp. Not taking any drops. Wears her glasses for distance as little as possible. Said she can see far or read well with her glasses.       Last edited by [name], OA on 07/24/2024 11:46 AM.      HISTORICAL INFORMATION:   CURRENT MEDICATIONS: Current Outpatient Medications (Ophthalmic Drugs)  Medication Sig  . artificial tears, hypromellose, (GENTEAL TEARS) 0.3 % gel Apply 1 drop to both eyes as needed.   No current facility-administered medications for this visit. (Ophthalmic Drugs)   Current Outpatient Medications (Other)  Medication Sig  . atorvastatin  (LIPITOR) 10 mg tablet Take 1 tablet (10 mg total) by mouth at bedtime.  . calcium  carbonate (TUMS) 500 mg (200 mg calcium ) chewable tablet Take 600 mg by mouth nightly.  . cetirizine-pseudoePHEDrine (ZyrTEC) 5-120 mg ER tablet Take 1 tablet by mouth 2 (two) times a day for 14 days.  . citalopram  (CeleXA ) 20 mg tablet Take 1 tablet (20 mg total) by mouth daily Indications: psychiatric disorder.  . fluticasone propionate (FLONASE) 50 mcg/spray nasal spray Administer 1 spray into each nostril daily.  . gabapentin  (NEURONTIN ) 100 mg capsule May take 1-3 capsules at bedtime.  SABRA LORazepam  (ATIVAN) 0.5 mg tablet Take 1 tablet (0.5 mg total) by mouth every 8 (eight) hours as needed for anxiety. Not to take with Ambien   . multivitamin (THERAGRAN) tab tablet Take 1 tablet by mouth Once Daily.  . pantoprazole  (PROTONIX ) 40 mg EC tablet Take 40 mg by mouth every morning before breakfast.  . zolpidem  (AMBIEN ) 5 mg tablet Take 1 tablet (5 mg total) by mouth nightly as needed for sleep.   No current facility-administered medications for this visit. (Other)    ALLERGIES No Known Allergies  PAST MEDICAL HISTORY Past Medical History:  Diagnosis Date  . Adjustment disorder with mixed emotional features   . Colonic polyp   . Dysthymia   . Generalized anxiety disorder   . Insomnia   . Nuclear sclerotic cataract of both eyes   . Osteopenia of multiple sites   . Ovarian cyst   . Perennial allergic rhinitis with seasonal variation   . Postmenopausal syndrome   . Pterygium of left eye   . Pterygium of right eye    Past Surgical History:  Procedure Laterality Date  . EYE SURGERY  09/22/2011   Procedure: PTERYGIUM EXCISION;  Surgeon: Donnice Caresse, MD;  Location: [facility] OUTPATIENT OR;  Service: Ophthalmology;  Laterality: Left;  Excision pterygium left eye with amniotic membrane.  SABRA EYE SURGERY Right 10/03/2013   Procedure: PTERYGIUM EXCISION;  Surgeon: Donnice Caresse, MD;  Location: [facility] OUTPATIENT  OR;  Service: Ophthalmology;  Laterality: Right;  . PTERYGIUM EXCISION W/ GRAFT  09-22-11   Procedure: PTERYGIUM EXCISION W/ GRAFT; OS  . TAH-BSO (TOTAL ABDOMINAL HYSTERECTOMY W/ BILATERAL SALPINGOOPHORECTOMY  1991   Procedure: TOTAL ABDOMINAL HYSTERECTOMY W/ BILATERAL SALPINGOOPHORECTOMY; [facility]  . TONSILLECTOMY  childhood   Procedure: TONSILLECTOMY    FAMILY HISTORY Family History  Problem Relation Name Age of Onset  . Hypertension Mother    . Thyroid disease Mother    . Cataracts Mother    . Hyperlipidemia Mother    . Hypertension Father baseball   .  Cataracts Father baseball   . Hyperlipidemia Father baseball   . Endometriosis Daughter    . Cancer Maternal Aunt    . Cancer Paternal Aunt    . Stroke Maternal Grandmother    . Heart disease Maternal Grandmother    . Diabetes Neg Hx    . Glaucoma Neg Hx    . Macular degeneration Neg Hx    . Retinal detachment Neg Hx    . Heart attack Neg Hx      SOCIAL HISTORY Social History   Tobacco Use  . Smoking status: Never  . Smokeless tobacco: Never  Substance Use Topics  . Alcohol use: Yes         OPHTHALMIC EXAM:   Base Eye Exam     Visual Acuity (Snellen - Linear)       Right Left   Dist cc 20/30 -1 20/20 +2    Correction: Glasses         Tonometry (Tonopen: Tetracaine OU, 11:58 AM)       Right Left   Pressure 13 12         Pupils       Pupils Shape React APD   Right PERRL Round Minimal None   Left PERRL Round Minimal None         Neuro/Psych     Oriented x3: Yes   Mood/Affect: Normal           Additional Tests     Glare Testing       Medium   Right 20/400   Left 20/400           Slit Lamp and Fundus Exam     Slit Lamp Exam       Right Left   Lids/Lashes Dermatochalasis UL Dermatochalasis UL   Conjunctiva/Sclera Conjunctivochalasis Conjunctivochalasis   Cornea Clear, scarring peripherally Clear, small pterygium regrowth nasally   Anterior Chamber Deep and quiet Deep and quiet   Iris Round and reactive Round and reactive   Lens 3+ NS 3+ NS   Anterior Vitreous Normal Normal         Fundus Exam       Right Left   Disc Normal Normal   C/D Ratio 0.3 0.3   Macula Normal Normal           Refraction     Manifest Refraction       Sphere Cylinder Axis Dist VA   Right -1.00 +0.50 175 20/30 -2   Left +0.75 +1.00 180 20/25            IMAGING AND PROCEDURES:    ASSESSMENT/PLAN:  1. Age-related nuclear cataract of both eyes  tetracaine HCl (PF) (ALTACAINE) 0.5 % ophthalmic solution 1 drop   moxifloxacin  (VIGAMOX) 0.5 % ophthalmic solution 1 drop   phenylephrine 2.5%/tropicamide 0.25%/diclofenac 0.05% ophthalmic solution   Case request operating room: EXTRACTION CATARACT  WITH INTRAOCULAR LENS INSERTION   Case request operating room: EXTRACTION CATARACT WITH INTRAOCULAR LENS INSERTION    2. History of pterygium excision  tetracaine HCl (PF) (ALTACAINE) 0.5 % ophthalmic solution 1 drop   moxifloxacin (VIGAMOX) 0.5 % ophthalmic solution 1 drop   phenylephrine 2.5%/tropicamide 0.25%/diclofenac 0.05% ophthalmic solution   Case request operating room: EXTRACTION CATARACT WITH INTRAOCULAR LENS INSERTION   Case request operating room: EXTRACTION CATARACT WITH INTRAOCULAR LENS INSERTION       Cataract OU - The patient complains of painless and progressive decreased vision in both eyes equally since 2 years ago, exacerbated by glare from headlights at night, and interfering with activities of daily living. - The patient expresses a desire for improved vision and is interested in surgical correction if indicated.  - A reasonable expectation exists that lens surgery will significantly improve both the visual and functional status of the patient. - Discussed the option of multifocal lenses, explained that I would not advise having them placed vs monofocal lenses due to decreased overall visual potential. Pt wishes to proceed with monofocal lenses. - Discussed laser-assisted phaco vs toric lens vs traditional phaco as possible treatment with rba. Pt wishes to proceed with traditional phaco.  - Will schedule phaco OD topical then two weeks later phaco OS topical  Hx of pterygium excision OU (Dr. Caresse)  - OD 09/2011 and OS 09/2013 - small recurrence nasally OS   RTC - Phaco OD topical Return for Scheduling surgery.   There are no Patient Instructions on file for this visit.   Ophthalmic Meds Ordered this visit:  There were no meds ordered this visit.    Explained the diagnoses, plan, and  follow up with the patient and they expressed understanding.  Patient expressed understanding of the importance of proper follow up care.    Abbreviations: M myopia (nearsighted); A astigmatism; H hyperopia (farsighted); P presbyopia; Mrx spectacle prescription;  CTL contact lenses; OD right eye; OS left eye; OU both eyes  XT exotropia; ET esotropia; PEK punctate epithelial keratitis; PEE punctate epithelial erosions; DES dry eye syndrome; MGD meibomian gland dysfunction; ATs artificial tears; PFAT's preservative free artificial tears; NSC nuclear sclerotic cataract; PSC posterior subcapsular cataract; ERM epi-retinal membrane; PVD posterior vitreous detachment; RD retinal detachment; DM diabetes mellitus; DR diabetic retinopathy; NPDR non-proliferative diabetic retinopathy; PDR proliferative diabetic retinopathy; CSME clinically significant macular edema; DME diabetic macular edema; dbh dot blot hemorrhages; CWS cotton wool spot; POAG primary open angle glaucoma; C/D cup-to-disc ratio; HVF humphrey visual field; GVF goldmann visual field; OCT optical coherence tomography; IOP intraocular pressure; BRVO Branch retinal vein occlusion; CRVO central retinal vein occlusion; CRAO central retinal artery occlusion; BRAO branch retinal artery occlusion; RT retinal tear; SB scleral buckle; PPV pars plana vitrectomy; VH Vitreous hemorrhage; PRP panretinal laser photocoagulation; IVK intravitreal kenalog; VMT vitreomacular traction; MH Macular hole;  NVD neovascularization of the disc; NVE neovascularization elsewhere; AREDS age related eye disease study; ARMD age related macular degeneration; POAG primary open angle glaucoma; EBMD epithelial/anterior basement membrane dystrophy; ACIOL anterior chamber intraocular lens; IOL intraocular lens; PCIOL posterior chamber intraocular lens; Phaco/IOL phacoemulsification with intraocular lens placement; PRK photorefractive keratectomy; LASIK laser assisted in situ keratomileusis;  HTN hypertension; DM diabetes mellitus; COPD chronic obstructive pulmonary disease   This document serves as a record of services personally performed by Donnice Caresse, MD. It was created on their behalf by Jinnie Caldron, Scribe, a trained medical scribe. The creation of this record is the provider's dictation and/or activities during  the visit.

## 2024-07-24 DIAGNOSIS — H2513 Age-related nuclear cataract, bilateral: Secondary | ICD-10-CM | POA: Insufficient documentation

## 2024-07-24 DIAGNOSIS — Z9889 Other specified postprocedural states: Secondary | ICD-10-CM | POA: Insufficient documentation

## 2024-08-14 ENCOUNTER — Encounter: Payer: Self-pay | Admitting: Family Medicine

## 2024-08-14 ENCOUNTER — Telehealth: Payer: Self-pay

## 2024-08-14 ENCOUNTER — Ambulatory Visit (INDEPENDENT_AMBULATORY_CARE_PROVIDER_SITE_OTHER): Admitting: Family Medicine

## 2024-08-14 VITALS — BP 120/84 | HR 95 | Temp 98.2°F | Ht 62.0 in | Wt 116.1 lb

## 2024-08-14 DIAGNOSIS — G4709 Other insomnia: Secondary | ICD-10-CM | POA: Diagnosis not present

## 2024-08-14 DIAGNOSIS — Z23 Encounter for immunization: Secondary | ICD-10-CM

## 2024-08-14 DIAGNOSIS — M810 Age-related osteoporosis without current pathological fracture: Secondary | ICD-10-CM | POA: Diagnosis not present

## 2024-08-14 DIAGNOSIS — Z7689 Persons encountering health services in other specified circumstances: Secondary | ICD-10-CM

## 2024-08-14 DIAGNOSIS — N951 Menopausal and female climacteric states: Secondary | ICD-10-CM

## 2024-08-14 DIAGNOSIS — K2100 Gastro-esophageal reflux disease with esophagitis, without bleeding: Secondary | ICD-10-CM

## 2024-08-14 DIAGNOSIS — F411 Generalized anxiety disorder: Secondary | ICD-10-CM

## 2024-08-14 DIAGNOSIS — E785 Hyperlipidemia, unspecified: Secondary | ICD-10-CM | POA: Diagnosis not present

## 2024-08-14 MED ORDER — DENOSUMAB 60 MG/ML ~~LOC~~ SOSY: 60.0000 mg | PREFILLED_SYRINGE | Freq: Once | SUBCUTANEOUS | Status: AC

## 2024-08-14 MED ORDER — ZOLPIDEM TARTRATE 5 MG PO TABS: 5.0000 mg | ORAL_TABLET | Freq: Every evening | ORAL | 1 refills | Status: DC | PRN

## 2024-08-14 MED ORDER — GABAPENTIN 100 MG PO CAPS: 100.0000 mg | ORAL_CAPSULE | Freq: Three times a day (TID) | ORAL | 1 refills | Status: AC

## 2024-08-14 NOTE — Telephone Encounter (Signed)
 Prolia  VOB initiated via [email or web address]  Next Prolia  inj DUE: [place]

## 2024-08-14 NOTE — Progress Notes (Signed)
 Patient Office Visit  Assessment & Plan:  Encounter to establish care  Immunization due -     Flu vaccine HIGH DOSE PF(Fluzone Trivalent)  Other insomnia -     Zolpidem  Tartrate; Take 1 tablet (5 mg total) by mouth at bedtime as needed for sleep.  Dispense: 30 tablet; Refill: 1 -     TSH  Generalized anxiety disorder -     Comprehensive metabolic panel with GFR  Hyperlipidemia, unspecified hyperlipidemia type -     Lipid panel  Osteoporosis, unspecified osteoporosis type, unspecified pathological fracture presence -     VITAMIN D  25 Hydroxy (Vit-D Deficiency, Fractures) -     Denosumab   Gastroesophageal reflux disease with esophagitis, unspecified whether hemorrhage -     CBC with Differential/Platelet  Menopausal symptom -     Gabapentin ; Take 1 capsule (100 mg total) by mouth 3 (three) times daily.  Dispense: 270 capsule; Refill: 1   Assessment and Plan    Hyperlipidemia Cholesterol levels elevated. On Lipitor without side effects. - Order blood work to check cholesterol levels.  Gastroesophageal reflux disease with esophagitis GERD with history of gastric ulcers and esophageal stricture, managed with pantoprazole . Advised to avoid NSAIDs. - Continue pantoprazole . - Avoid NSAIDs.  Osteoporosis Osteoporosis diagnosed. Fosamax not recommended due to gastric ulcers. Discussed Prolia , noting rare osteonecrosis risk. Emphasized fracture prevention, weight-bearing exercises, calcium , and vitamin D . - Check insurance coverage for Prolia . - Encourage weight-bearing exercises. - Ensure adequate calcium  and vitamin D  intake.  Menopausal symptoms with persistent hot flashes Persistent hot flashes managed with gabapentin . Previously taken three times daily, now less frequent. - Prescribe gabapentin  with option to take three times a day if needed.  Insomnia Insomnia managed with zolpidem . Discussed dementia risk, but she values sleep quality. Noted insomnia during full  moons, with fatigue and hyperactivity at night. - Prescribe zolpidem .  General Health Maintenance Received high-dose flu shot. Plans for shingles vaccine. Blood work to include CBC, kidney, liver, glucose, cholesterol, thyroid, and vitamin D  levels. - Administer high-dose flu shot. - Order blood work including CBC, kidney, liver, glucose, cholesterol, thyroid, and vitamin D  levels. - Plan for shingles vaccine at a later date.  Follow-Up Discussed follow-up interval and appointment scheduling. - Schedule follow-up in six months unless issues arise. - Advise to get shingles vaccine at pharmacy if preferred.      Test results were reviewed and analyzed as part of the medical decision making of this visit.  Viewed previous notes and lab work from [facility].  Also reviewed DEXA scan that was done last November during the office visit.  Follow-up on lab work and notify patient. Recommend healthy diet i.e mediterranean/DASH diet, consistent exercise - 30 minutes 5 day per week, and gradual weight loss  Return in about 6 months (around 02/11/2025), or if symptoms worsen or fail to improve.   Subjective:    Patient ID: Dana Waters, female    DOB: Apr 15, 1946  Age: 78 y.o. MRN: [id number]  Chief Complaint  Patient presents with   Medical Management of Chronic Issues   Establish Care    HPI  History of Present Illness       Dana Waters is here to establish care and to discuss chronic insomnia, osteoporosis, GERD, anxiety and hyperlipidemia.  Patient did have the bone density done last November but we did not get to discuss the results since I left [facility].   Osteoporosis-Discussed DEXA scan from November 2024. Fosamax is not a good  option due to previous history of gastric ulcers and esophagitis.  Follow-up endoscopy done this year was normal per patient.  Patient is taking Protonix  40 mg once a day.  Patient is taking calcium  vitamin D  weightbearing.  Patient has heard  of Prolia  but not sure if her insurance will cover this. GERD-patient is taking Protonix  40 mg once a day without nausea vomiting hematemesis medic easier.  Patient does not take NSAIDs and has had no episodes of bleeding.  Gastric ulcers have healed.  [facility]. Hyperlipidemia-denies unusual muscle aches or muscle cramps or difficulty tolerating statin therapy.  Aware of need for diet control, exercise and healthy eating.  Overall patient does eat a healthy diet and stays active.. Chronic Insomnia- patient has been taking Ambien  5mg  at night time without cognitive deficits. Patient aware of negative long term effects of taking Ambien . Patient is unable to sleep without medication.  Anxiety- patient taking Celexa  which is effective. Has been on this for several years.   Results Colonoscopy: Healed ulcers, presence of diverticulum, no polyps found (06/03/2024)  Assessment and Plan Hyperlipidemia Cholesterol levels elevated. On Lipitor without side effects. - Order blood work to check cholesterol levels.  Gastroesophageal reflux disease with previous history of ulcers/esophagitis GERD with history of gastric ulcers and esophageal stricture, managed with pantoprazole . Advised to avoid NSAIDs. - Continue pantoprazole . - Avoid NSAIDs.  Osteoporosis Osteoporosis diagnosed. Fosamax not recommended due to previous history of gastric ulcers. Discussed Prolia , noting rare osteonecrosis of the jaw.  Emphasized fracture prevention, weight-bearing exercises, calcium , and vitamin D . - Check insurance coverage for Prolia . - Encourage weight-bearing exercises and strength training.  - Ensure adequate calcium  and vitamin D  intake.  Menopausal symptoms with persistent hot flashes Persistent hot flashes managed with gabapentin . Previously taken three times daily, now less frequent. - Prescribe gabapentin  with option to take three times a day if needed.  Insomnia Insomnia managed with zolpidem . Discussed  dementia risk, but she values sleep quality. Noted insomnia during full moons, with fatigue and hyperactivity at night. - Prescribe zolpidem .  General Health Maintenance Received high-dose flu shot. Plans for shingles vaccine. Blood work to include CBC, kidney, liver, glucose, cholesterol, thyroid, and vitamin D  levels. - Administer high-dose flu shot. - Order blood work including CBC, kidney, liver, glucose, cholesterol, thyroid, and vitamin D  levels. - Plan for shingles vaccine at a later date.  Follow-Up Discussed follow-up interval and appointment scheduling. - Schedule follow-up in six months unless issues arise. - Advise to get shingles vaccine at pharmacy if preferred.    The 10-year ASCVD risk score (Arnett DK, et al., 2019) is: 18.1%  Past Medical History:  Diagnosis Date   Acute gastric ulcer with hemorrhage    Arthritis    Blood transfusion without reported diagnosis    after gastric ulcers   Cataract 2025   Diverticulosis    GERD (gastroesophageal reflux disease)    History of adenomatous polyp of colon 2012   Dr. Abran   Hyperlipidemia 2024   Osteoporosis    Past Surgical History:  Procedure Laterality Date   ABDOMINAL HYSTERECTOMY  1991?   total   BIOPSY  02/07/2023   Procedure: BIOPSY;  Surgeon: Aneita Gwendlyn DASEN, MD;  Location: [facility];  Service: Gastroenterology;;   ESOPHAGOGASTRODUODENOSCOPY (EGD) WITH PROPOFOL  N/A 02/07/2023   Procedure: ESOPHAGOGASTRODUODENOSCOPY (EGD) WITH PROPOFOL ;  Surgeon: Aneita Gwendlyn DASEN, MD;  Location: [facility];  Service: Gastroenterology;  Laterality: N/A;   EYE SURGERY  pterigym eye   TONSILLECTOMY  Social History   Tobacco Use   Smoking status: Never   Smokeless tobacco: Never   Tobacco comments:    never  Vaping Use   Vaping status: Never Used  Substance Use Topics   Alcohol use: Yes    Alcohol/week: 3.0 standard drinks of alcohol    Types: 3 Standard drinks or equivalent per week    Comment: social    Drug use: Never   Family History  Problem Relation Age of Onset   Hypertension Mother    Stroke Mother    Osteoporosis Mother    Hypertension Father    Hypertension Sister    Osteoporosis Sister    Osteoarthritis Sister    Colon cancer Paternal Aunt    Esophageal cancer Neg Hx    Stomach cancer Neg Hx    Rectal cancer Neg Hx    No Known Allergies  ROS    Objective:    BP 120/84   Pulse 95   Temp 98.2 F (36.8 C)   Ht 5' 2 (1.575 m)   Wt 116 lb 2 oz (52.7 kg)   SpO2 99%   BMI 21.24 kg/m  BP Readings from Last 3 Encounters:  08/14/24 120/84  06/03/24 (!) 126/54  03/20/24 124/70   Wt Readings from Last 3 Encounters:  08/14/24 116 lb 2 oz (52.7 kg)  06/03/24 117 lb (53.1 kg)  03/20/24 117 lb (53.1 kg)    Physical Exam Vitals and nursing note reviewed.  Constitutional:      Appearance: Normal appearance.  HENT:     Head: Normocephalic.     Right Ear: Tympanic membrane, ear canal and external ear normal.     Left Ear: Tympanic membrane, ear canal and external ear normal.  Eyes:     Extraocular Movements: Extraocular movements intact.     Conjunctiva/sclera: Conjunctivae normal.     Pupils: Pupils are equal, round, and reactive to light.  Cardiovascular:     Rate and Rhythm: Normal rate and regular rhythm.     Heart sounds: Normal heart sounds.  Pulmonary:     Effort: Pulmonary effort is normal.     Breath sounds: Normal breath sounds.  Musculoskeletal:     Right lower leg: No edema.     Left lower leg: No edema.  Neurological:     General: No focal deficit present.     Mental Status: She is alert and oriented to person, place, and time.  Psychiatric:        Mood and Affect: Mood normal.        Behavior: Behavior normal.        Thought Content: Thought content normal.        Judgment: Judgment normal.      No results found for any visits on 08/14/24.

## 2024-08-16 ENCOUNTER — Other Ambulatory Visit: Payer: Self-pay | Admitting: Family Medicine

## 2024-08-16 ENCOUNTER — Ambulatory Visit: Payer: Self-pay | Admitting: Family Medicine

## 2024-08-16 MED ORDER — ATORVASTATIN CALCIUM 10 MG PO TABS: 10.0000 mg | ORAL_TABLET | Freq: Every day | ORAL | 1 refills | Status: AC

## 2024-08-16 NOTE — Telephone Encounter (Signed)
 Requested Prescriptions  Pending Prescriptions Disp Refills   atorvastatin  (LIPITOR) 10 MG tablet 90 tablet 1    Sig: Take 1 tablet (10 mg total) by mouth daily.     Cardiovascular:  Antilipid - Statins Failed - 08/16/2024  3:17 PM      Failed - Lipid Panel in normal range within the last 12 months    Cholesterol  Date Value Ref Range Status  08/14/2024 168 <200 mg/dL Final   LDL Cholesterol (Calc)  Date Value Ref Range Status  08/14/2024 80 mg/dL (calc) Final    Comment:    Reference range: <100 . Desirable range <100 mg/dL for primary prevention;   <70 mg/dL for patients with CHD or diabetic patients  with > or = 2 CHD risk factors. SABRA LDL-C is now calculated using the Martin-Hopkins  calculation, which is a validated novel method providing  better accuracy than the Friedewald equation in the  estimation of LDL-C.  Gladis APPLETHWAITE et al. SANDREA. 7986;689(80): 2061-2068  (http://education.[facility].com/faq/FAQ164)    HDL  Date Value Ref Range Status  08/14/2024 74 > OR = 50 mg/dL Final   Triglycerides  Date Value Ref Range Status  08/14/2024 67 <150 mg/dL Final         Passed - Patient is not pregnant      Passed - Valid encounter within last 12 months    Recent Outpatient Visits           2 days ago Encounter to establish care   [facility] Aletha Bene, MD   8 years ago Wound, open, nose, initial encounter   [facility] at [facility], MD   10 years ago Pain in joint, ankle and foot   Primary Care at [facility], Harlene BROCKS, MD

## 2024-08-16 NOTE — Progress Notes (Signed)
 A1c added per Dr Aletha.

## 2024-08-16 NOTE — Telephone Encounter (Signed)
 Copied from CRM [id number]. Topic: Clinical - Medication Refill >> Aug 16, 2024 11:28 AM Delon HERO wrote: Medication: atorvastatin  (LIPITOR) 10 MG tablet [[id number]]  Has the patient contacted their pharmacy? Yes (Agent: If no, request that the patient contact the pharmacy for the refill. If patient does not wish to contact the pharmacy document the reason why and proceed with request.) (Agent: If yes, when and what did the pharmacy advise?)  This is the patient's preferred pharmacy:  [facility] [place], [place] - [address] [facility] [address] [place] [address] Phone: [phone] Fax: [phone]  Is this the correct pharmacy for this prescription? Yes If no, delete pharmacy and type the correct one.   Has the prescription been filled recently? Yes  Is the patient out of the medication? Yes  Has the patient been seen for an appointment in the last year OR does the patient have an upcoming appointment? Yes  Can we respond through MyChart? Yes  Agent: Please be advised that Rx refills may take up to 3 business days. We ask that you follow-up with your pharmacy.

## 2024-08-16 NOTE — Telephone Encounter (Signed)
 Dana Waters

## 2024-08-16 NOTE — Telephone Encounter (Signed)
 Pt ready for scheduling for PROLIA  on or after : 08/16/24  Option# 1: Buy/Bill (Office supplied medication)  Out-of-pocket cost due at time of clinic visit: $0  Number of injection/visits approved: ---  Primary: MEDICARE Prolia  co-insurance: 0% Admin fee co-insurance: 0%  Secondary: HUMANA-MEDSUP Prolia  co-insurance:  Admin fee co-insurance:   Medical Benefit Details: Date Benefits were checked: 08/15/24 Deductible: $257 Met of $257 Required/ Coinsurance: 0%/ Admin Fee: 0%  Prior Auth: N/A PA# Expiration Date:   # of doses approved: ----------------------------------------------------------------------- Option# 2- Med Obtained from pharmacy:  Pharmacy benefit: Copay $--- (Paid to pharmacy) Admin Fee: --- (Pay at clinic)  Prior Auth: --- PA# Expiration Date:   # of doses approved:   If patient wants fill through the pharmacy benefit please send prescription to: ---, and include estimated need by date in rx notes. Pharmacy will ship medication directly to the office.  Patient NOT eligible for Prolia  Copay Card. Copay Card can make patient's cost as little as $25. Link to apply: [email or web address]://[email or web address]  ** This summary of benefits is an estimation of the patient's out-of-pocket cost. Exact cost may very based on individual plan coverage.

## 2024-08-20 LAB — CBC WITH DIFFERENTIAL/PLATELET
Absolute Lymphocytes: 1267 {cells}/uL (ref 850–3900)
Absolute Monocytes: 509 {cells}/uL (ref 200–950)
Basophils Absolute: 58 {cells}/uL (ref 0–200)
Basophils Relative: 1.1 %
Eosinophils Absolute: 58 {cells}/uL (ref 15–500)
Eosinophils Relative: 1.1 %
HCT: 39.8 % (ref 35.0–45.0)
Hemoglobin: 13.1 g/dL (ref 11.7–15.5)
MCH: 33.9 pg — ABNORMAL HIGH (ref 27.0–33.0)
MCHC: 32.9 g/dL (ref 32.0–36.0)
MCV: 102.8 fL — ABNORMAL HIGH (ref 80.0–100.0)
MPV: 10.1 fL (ref 7.5–12.5)
Monocytes Relative: 9.6 %
Neutro Abs: 3408 {cells}/uL (ref 1500–7800)
Neutrophils Relative %: 64.3 %
Platelets: 266 Thousand/uL (ref 140–400)
RBC: 3.87 Million/uL (ref 3.80–5.10)
RDW: 11.5 % (ref 11.0–15.0)
Total Lymphocyte: 23.9 %
WBC: 5.3 Thousand/uL (ref 3.8–10.8)

## 2024-08-20 LAB — COMPREHENSIVE METABOLIC PANEL WITH GFR
AG Ratio: 1.9 (calc) (ref 1.0–2.5)
ALT: 15 U/L (ref 6–29)
AST: 19 U/L (ref 10–35)
Albumin: 4.5 g/dL (ref 3.6–5.1)
Alkaline phosphatase (APISO): 69 U/L (ref 37–153)
BUN: 16 mg/dL (ref 7–25)
CO2: 28 mmol/L (ref 20–32)
Calcium: 10.1 mg/dL (ref 8.6–10.4)
Chloride: 105 mmol/L (ref 98–110)
Creat: 0.91 mg/dL (ref 0.60–1.00)
Globulin: 2.4 g/dL (ref 1.9–3.7)
Glucose, Bld: 119 mg/dL — ABNORMAL HIGH (ref 65–99)
Potassium: 5.1 mmol/L (ref 3.5–5.3)
Sodium: 139 mmol/L (ref 135–146)
Total Bilirubin: 0.6 mg/dL (ref 0.2–1.2)
Total Protein: 6.9 g/dL (ref 6.1–8.1)
eGFR: 65 mL/min/1.73m2 (ref >=60)

## 2024-08-20 LAB — HEMOGLOBIN A1C
Hgb A1c MFr Bld: 5.6 % (ref <5.7)
Mean Plasma Glucose: 114 mg/dL
eAG (mmol/L): 6.3 mmol/L

## 2024-08-20 LAB — VITAMIN D 25 HYDROXY (VIT D DEFICIENCY, FRACTURES): Vit D, 25-Hydroxy: 30 ng/mL (ref 30–100)

## 2024-08-20 LAB — LIPID PANEL
Cholesterol: 168 mg/dL (ref <200)
HDL: 74 mg/dL (ref >=50)
LDL Cholesterol (Calc): 80 mg/dL
Non-HDL Cholesterol (Calc): 94 mg/dL (ref <130)
Total CHOL/HDL Ratio: 2.3 (calc) (ref <5.0)
Triglycerides: 67 mg/dL (ref <150)

## 2024-08-20 LAB — TEST AUTHORIZATION

## 2024-08-20 LAB — TSH: TSH: 0.85 m[IU]/L (ref 0.40–4.50)

## 2024-09-11 HISTORY — PX: CATARACT EXTRACTION: SUR2

## 2024-09-25 ENCOUNTER — Ambulatory Visit (INDEPENDENT_AMBULATORY_CARE_PROVIDER_SITE_OTHER)

## 2024-09-25 VITALS — Ht 62.0 in | Wt 116.0 lb

## 2024-09-25 DIAGNOSIS — Z0000 Encounter for general adult medical examination without abnormal findings: Secondary | ICD-10-CM

## 2024-09-25 NOTE — Progress Notes (Signed)
 Subjective:   Dana Waters is a 78 y.o. who presents for a Medicare Wellness preventive visit.  As a reminder, Annual Wellness Visits don't include a physical exam, and some assessments may be limited, especially if this visit is performed virtually. We may recommend an in-person follow-up visit with your provider if needed.  Visit Complete: Virtual I connected with  Dana Waters on 09/25/24 by a audio enabled telemedicine application and verified that I am speaking with the correct person using two identifiers.  Patient Location: Home  Provider Location: Home Office  I discussed the limitations of evaluation and management by telemedicine. The patient expressed understanding and agreed to proceed.  Vital Signs: Because this visit was a virtual/telehealth visit, some criteria may be missing or patient reported. Any vitals not documented were not able to be obtained and vitals that have been documented are patient reported.  VideoDeclined- This patient declined Librarian, academic. Therefore the visit was completed with audio only.  Persons Participating in Visit: Patient.  AWV Questionnaire: Yes: Patient Medicare AWV questionnaire was completed by the patient on 09/23/24; I have confirmed that all information answered by patient is correct and no changes since this date.  Cardiac Risk Factors include: advanced age (>36men, >74 women)     Objective:    Today's Vitals   09/25/24 1025  Weight: 116 lb (52.6 kg)  Height: 5' 2 (1.575 m)   Body mass index is 21.22 kg/m.     09/25/2024   10:30 AM 02/06/2023   11:00 PM 02/06/2023    2:10 PM 09/20/2015    4:55 PM  Advanced Directives  Does Patient Have a Medical Advance Directive? No No No No   Would patient like information on creating a medical advance directive? Yes (MAU/Ambulatory/Procedural Areas - Information given) No - Patient declined  No - patient declined information      Data saved with a  previous flowsheet row definition    Current Medications (verified) Outpatient Encounter Medications as of 09/25/2024  Medication Sig   acetaminophen  (TYLENOL ) 325 MG tablet Take 2 tablets (650 mg total) by mouth every 6 (six) hours as needed for mild pain (or Fever >/= 101).   atorvastatin  (LIPITOR) 10 MG tablet Take 1 tablet (10 mg total) by mouth daily.   CALCIUM -VITAMIN D  PO Take 1 tablet by mouth daily.   cimetidine (TAGAMET) 200 MG tablet Take 200 mg by mouth as needed.   citalopram  (CELEXA ) 20 MG tablet Take 1 tablet (20 mg total) by mouth at bedtime.   gabapentin  (NEURONTIN ) 100 MG capsule Take 1 capsule (100 mg total) by mouth 3 (three) times daily.   Multiple Vitamins-Minerals (ALIVE WOMENS 50+) TABS Take 1 tablet by mouth daily as needed (when remembers).   pantoprazole  (PROTONIX ) 40 MG tablet Take 1 tablet (40 mg total) by mouth daily.   polyethylene glycol (MIRALAX ) 17 g packet Take 17 g by mouth daily.   zolpidem  (AMBIEN ) 5 MG tablet Take 1 tablet (5 mg total) by mouth at bedtime as needed for sleep.   ondansetron  (ZOFRAN ) 4 MG tablet Take 1 tablet (4 mg total) by mouth every 8 (eight) hours as needed for nausea or vomiting. (Patient not taking: Reported on 09/25/2024)   Facility-Administered Encounter Medications as of 09/25/2024  Medication   denosumab  (PROLIA ) injection 60 mg    Allergies (verified) Patient has no known allergies.   History: Past Medical History:  Diagnosis Date   Acute gastric ulcer with hemorrhage  Arthritis    Blood transfusion without reported diagnosis    after gastric ulcers   Cataract 2025   Diverticulosis    GERD (gastroesophageal reflux disease)    History of adenomatous polyp of colon 2012   Dr. Abran   Hyperlipidemia 2024   Osteoporosis    Past Surgical History:  Procedure Laterality Date   ABDOMINAL HYSTERECTOMY  1991?   total   BIOPSY  02/07/2023   Procedure: BIOPSY;  Surgeon: Aneita Gwendlyn DASEN, MD;  Location: [facility];   Service: Gastroenterology;;   CATARACT EXTRACTION Right 09/2024   ESOPHAGOGASTRODUODENOSCOPY (EGD) WITH PROPOFOL  N/A 02/07/2023   Procedure: ESOPHAGOGASTRODUODENOSCOPY (EGD) WITH PROPOFOL ;  Surgeon: Aneita Gwendlyn DASEN, MD;  Location: [facility];  Service: Gastroenterology;  Laterality: N/A;   EYE SURGERY  pterigym eye   TONSILLECTOMY     Family History  Problem Relation Age of Onset   Hypertension Mother    Stroke Mother    Osteoporosis Mother    Hypertension Father    Hypertension Sister    Osteoporosis Sister    Osteoarthritis Sister    Colon cancer Paternal Aunt    Esophageal cancer Neg Hx    Stomach cancer Neg Hx    Rectal cancer Neg Hx    Social History   Socioeconomic History   Marital status: Married    Spouse name: Not on file   Number of children: 2   Years of education: Not on file   Highest education level: Bachelor's degree (e.g., BA, AB, BS)  Occupational History   Occupation: Academic librarian- digital  Tobacco Use   Smoking status: Never   Smokeless tobacco: Never   Tobacco comments:    never  Vaping Use   Vaping status: Never Used  Substance and Sexual Activity   Alcohol use: Yes    Alcohol/week: 3.0 standard drinks of alcohol    Types: 3 Standard drinks or equivalent per week    Comment: social   Drug use: Never   Sexual activity: Not Currently    Birth control/protection: Post-menopausal    Comment: Hysterectomy  Other Topics Concern   Not on file  Social History Narrative   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 Patient stays busy and eats healthy.    Social Drivers of Corporate investment banker Strain: Low Risk  (09/23/2024)   Overall Financial Resource Strain (CARDIA)    Difficulty of Paying Living Expenses: Not hard at all  Food Insecurity: No Food Insecurity (09/23/2024)   Hunger Vital Sign    Worried About Running Out of Food in the Last Year: Never true    Ran Out of Food in the Last Year: Never true  Transportation Needs: No  Transportation Needs (09/23/2024)   PRAPARE - Administrator, Civil Service (Medical): No    Lack of Transportation (Non-Medical): No  Physical Activity: Inactive (09/23/2024)   Exercise Vital Sign    Days of Exercise per Week: 0 days    Minutes of Exercise per Session: 0 min  Stress: No Stress Concern Present (09/23/2024)   [employer] of Occupational Health - Occupational Stress Questionnaire    Feeling of Stress: Only a little  Social Connections: Socially Integrated (09/23/2024)   Social Connection and Isolation Panel    Frequency of Communication with Friends and Family: More than three times a week    Frequency of Social Gatherings with Friends and Family: Once a week    Attends Religious Services: More than 4 times per year  Active Member of Clubs or Organizations: Yes    Attends Banker Meetings: More than 4 times per year    Marital Status: Married    Tobacco Counseling Counseling given: Not Answered Tobacco comments: never    Clinical Intake:  Pre-visit preparation completed: Yes  Pain : No/denies pain  Diabetes: No  Lab Results  Component Value Date   HGBA1C 5.6 08/14/2024     How often do you need to have someone help you when you read instructions, pamphlets, or other written materials from your doctor or pharmacy?: 1 - Never  Interpreter Needed?: No  Information entered by :: Charmaine Bloodgood LPN   Activities of Daily Living     09/25/2024   10:30 AM  In your present state of health, do you have any difficulty performing the following activities:  Hearing? 0  Vision? 0  Difficulty concentrating or making decisions? 0  Walking or climbing stairs? 0  Dressing or bathing? 0  Doing errands, shopping? 0  Preparing Food and eating ? N  Using the Toilet? N  In the past six months, have you accidently leaked urine? N  Do you have problems with loss of bowel control? N  Managing your Medications? N  Managing your  Finances? N  Housekeeping or managing your Housekeeping? N    Patient Care Team: Aletha Bene, MD as PCP - General (Family Medicine) Caresse Cough, MD as Referring Physician (Ophthalmology) Abran Norleen SAILOR, MD as Consulting Physician (Gastroenterology)  I have updated your Care Teams any recent Medical Services you may have received from other providers in the past year.     Assessment:   This is a routine wellness examination for Dana Waters.  Hearing/Vision screen Hearing Screening - Comments:: Patient is able to hear conversational tones without difficulty. No issues reported.   Vision Screening - Comments::  up to date with routine eye exams with Dr. Caresse    Goals Addressed             This Visit's Progress    Maintain health and independence   On track      Depression Screen     09/25/2024   10:29 AM 08/14/2024   11:23 AM 09/20/2015    2:37 PM  PHQ 2/9 Scores  PHQ - 2 Score 0 0 0    Fall Risk     09/25/2024   10:30 AM  Fall Risk   Falls in the past year? 0  Number falls in past yr: 0  Injury with Fall? 0  Risk for fall due to : No Fall Risks  Follow up Falls prevention discussed;Education provided;Falls evaluation completed    MEDICARE RISK AT HOME:  Medicare Risk at Home Any stairs in or around the home?: No If so, are there any without handrails?: No Home free of loose throw rugs in walkways, pet beds, electrical cords, etc?: Yes Adequate lighting in your home to reduce risk of falls?: Yes Life alert?: No Use of a cane, walker or w/c?: No Grab bars in the bathroom?: Yes Shower chair or bench in shower?: No Elevated toilet seat or a handicapped toilet?: Yes  TIMED UP AND GO:  Was the test performed?  No  Cognitive Function: 6CIT completed        09/25/2024   10:30 AM  6CIT Screen  What Year? 0 points  What month? 0 points  What time? 0 points  Count back from 20 0 points  Months in reverse 0 points  Repeat phrase 0 points   Total Score 0 points    Immunizations Immunization History  Administered Date(s) Administered   Fluad Quad(high Dose 65+) 11/15/2021   INFLUENZA, HIGH DOSE SEASONAL PF 10/09/2017, 10/10/2018, 11/02/2023, 08/14/2024   Influenza,inj,quad, With Preservative 11/21/2014   Influenza-Unspecified 11/21/2014, 12/16/2016   PFIZER(Purple Top)SARS-COV-2 Vaccination 01/17/2020, 02/07/2020, 06/06/2020, 09/24/2020   Pneumococcal Conjugate-13 05/17/2016   Pneumococcal Polysaccharide-23 11/21/2014, 07/19/2021   Tdap 09/20/2015   Zoster, Live 07/07/2015   Zoster, Unspecified 07/07/2015    Screening Tests Health Maintenance  Topic Date Due   Hepatitis C Screening  Never done   Zoster Vaccines- Shingrix (1 of 2) 12/29/1996   COVID-19 Vaccine (5 - 2025-26 season) 08/12/2024   DTaP/Tdap/Td (2 - Td or Tdap) 09/19/2025   Medicare Annual Wellness (AWV)  09/25/2025   Colonoscopy  06/03/2029   Pneumococcal Vaccine: 50+ Years  Completed   Influenza Vaccine  Completed   DEXA SCAN  Completed   Meningococcal B Vaccine  Aged Out   Mammogram  Discontinued    Health Maintenance Items Addressed: Vaccines Due: Shingrix  Plans to schedule mammogram for the end of November   Additional Screening:  Vision Screening: Recommended annual ophthalmology exams for early detection of glaucoma and other disorders of the eye. Is the patient up to date with their annual eye exam?  Yes  Who is the provider or what is the name of the office in which the patient attends annual eye exams? Dr. Caresse  Dental Screening: Recommended annual dental exams for proper oral hygiene  Community Resource Referral / Chronic Care Management: CRR required this visit?  No   CCM required this visit?  No   Plan:    I have personally reviewed and noted the following in the patient's chart:   Medical and social history Use of alcohol, tobacco or illicit drugs  Current medications and supplements including opioid  prescriptions. Patient is not currently taking opioid prescriptions. Functional ability and status Nutritional status Physical activity Advanced directives List of other physicians Hospitalizations, surgeries, and ER visits in previous 12 months Vitals Screenings to include cognitive, depression, and falls Referrals and appointments  In addition, I have reviewed and discussed with patient certain preventive protocols, quality metrics, and best practice recommendations. A written personalized care plan for preventive services as well as general preventive health recommendations were provided to patient.   Dana Waters, [place]   [id number]   After Visit Summary: (MyChart) Due to this being a telephonic visit, the after visit summary with patients personalized plan was offered to patient via MyChart   Notes: Nothing significant to report at this time.

## 2024-09-25 NOTE — Patient Instructions (Signed)
 Dana Waters,  Thank you for taking the time for your Medicare Wellness Visit. I appreciate your continued commitment to your health goals. Please review the care plan we discussed, and feel free to reach out if I can assist you further.  Medicare recommends these wellness visits once per year to help you and your care team stay ahead of potential health issues. These visits are designed to focus on prevention, allowing your provider to concentrate on managing your acute and chronic conditions during your regular appointments.  Please note that Annual Wellness Visits do not include a physical exam. Some assessments may be limited, especially if the visit was conducted virtually. If needed, we may recommend a separate in-person follow-up with your provider.  Ongoing Care Seeing your primary care provider every 3 to 6 months helps us  monitor your health and provide consistent, personalized care.   Referrals If a referral was made during today's visit and you haven't received any updates within two weeks, please contact the referred provider directly to check on the status.  Recommended Screenings:  Health Maintenance  Topic Date Due   Hepatitis C Screening  Never done   Zoster (Shingles) Vaccine (1 of 2) 12/29/1996   COVID-19 Vaccine (5 - 2025-26 season) 08/12/2024   DTaP/Tdap/Td vaccine (2 - Td or Tdap) 09/19/2025   Medicare Annual Wellness Visit  09/25/2025   Colon Cancer Screening  06/03/2029   Pneumococcal Vaccine for age over 68  Completed   Flu Shot  Completed   DEXA scan (bone density measurement)  Completed   Meningitis B Vaccine  Aged Out   Breast Cancer Screening  Discontinued       09/25/2024   10:30 AM  Advanced Directives  Does Patient Have a Medical Advance Directive? No  Would patient like information on creating a medical advance directive? Yes (MAU/Ambulatory/Procedural Areas - Information given)   Advance Care Planning is important because it: Ensures you receive  medical care that aligns with your values, goals, and preferences. Provides guidance to your family and loved ones, reducing the emotional burden of decision-making during critical moments.  Information on Advanced Care Planning can be found at [place]  Secretary of [facility] Advance Health Care Directives Advance Health Care Directives ([email or web address]   Vision: Annual vision screenings are recommended for early detection of glaucoma, cataracts, and diabetic retinopathy. These exams can also reveal signs of chronic conditions such as diabetes and high blood pressure.  Dental: Annual dental screenings help detect early signs of oral cancer, gum disease, and other conditions linked to overall health, including heart disease and diabetes.  Please see the attached documents for additional preventive care recommendations.

## 2024-11-27 ENCOUNTER — Other Ambulatory Visit: Payer: Self-pay | Admitting: Family Medicine

## 2024-11-27 DIAGNOSIS — G4709 Other insomnia: Secondary | ICD-10-CM

## 2025-02-12 ENCOUNTER — Ambulatory Visit: Admitting: Family Medicine

## 2025-02-19 ENCOUNTER — Ambulatory Visit: Admitting: Family Medicine

## 2025-10-01 ENCOUNTER — Ambulatory Visit
